# Patient Record
Sex: Female | Born: 1940 | Race: Black or African American | Hispanic: No
Health system: Southern US, Community
[De-identification: ages and names within clinical notes are randomized; demographics above are authoritative.]

## PROBLEM LIST (undated history)

## (undated) DIAGNOSIS — N289 Disorder of kidney and ureter, unspecified: Secondary | ICD-10-CM

---

## 2014-03-25 ENCOUNTER — Inpatient Hospital Stay
Admission: AD | Admit: 2014-03-25 | Discharge: 2014-06-02 | Disposition: A | Discharge status: Skilled Nursing Facility | Payer: Self-pay | Source: Ambulatory Visit | Attending: Internal Medicine | Admitting: Internal Medicine

## 2014-03-25 ENCOUNTER — Other Ambulatory Visit (HOSPITAL_COMMUNITY): Payer: Self-pay

## 2014-03-25 DIAGNOSIS — J189 Pneumonia, unspecified organism: Secondary | ICD-10-CM

## 2014-03-25 DIAGNOSIS — R748 Abnormal levels of other serum enzymes: Secondary | ICD-10-CM

## 2014-03-25 DIAGNOSIS — I82602 Acute embolism and thrombosis of unspecified veins of left upper extremity: Secondary | ICD-10-CM

## 2014-03-25 DIAGNOSIS — R1011 Right upper quadrant pain: Secondary | ICD-10-CM

## 2014-03-25 DIAGNOSIS — W1800XA Striking against unspecified object with subsequent fall, initial encounter: Secondary | ICD-10-CM

## 2014-03-25 DIAGNOSIS — R1032 Left lower quadrant pain: Secondary | ICD-10-CM

## 2014-03-25 DIAGNOSIS — Z930 Tracheostomy status: Secondary | ICD-10-CM | POA: Insufficient documentation

## 2014-03-25 DIAGNOSIS — J90 Pleural effusion, not elsewhere classified: Secondary | ICD-10-CM

## 2014-03-25 DIAGNOSIS — Z4659 Encounter for fitting and adjustment of other gastrointestinal appliance and device: Secondary | ICD-10-CM

## 2014-03-25 DIAGNOSIS — Z9181 History of falling: Secondary | ICD-10-CM

## 2014-03-25 DIAGNOSIS — J9690 Respiratory failure, unspecified, unspecified whether with hypoxia or hypercapnia: Secondary | ICD-10-CM

## 2014-03-25 DIAGNOSIS — I82609 Acute embolism and thrombosis of unspecified veins of unspecified upper extremity: Secondary | ICD-10-CM | POA: Insufficient documentation

## 2014-03-25 DIAGNOSIS — T8249XA Other complication of vascular dialysis catheter, initial encounter: Secondary | ICD-10-CM

## 2014-03-25 DIAGNOSIS — L0291 Cutaneous abscess, unspecified: Secondary | ICD-10-CM

## 2014-03-25 DIAGNOSIS — Z931 Gastrostomy status: Secondary | ICD-10-CM

## 2014-03-25 DIAGNOSIS — J690 Pneumonitis due to inhalation of food and vomit: Secondary | ICD-10-CM

## 2014-03-25 HISTORY — DX: Disorder of kidney and ureter, unspecified: N28.9

## 2014-03-26 ENCOUNTER — Other Ambulatory Visit (HOSPITAL_COMMUNITY): Payer: Self-pay

## 2014-03-26 DIAGNOSIS — L0291 Cutaneous abscess, unspecified: Secondary | ICD-10-CM

## 2014-03-26 DIAGNOSIS — Z4659 Encounter for fitting and adjustment of other gastrointestinal appliance and device: Secondary | ICD-10-CM | POA: Insufficient documentation

## 2014-03-26 DIAGNOSIS — Z87898 Personal history of other specified conditions: Secondary | ICD-10-CM

## 2014-03-26 DIAGNOSIS — J9600 Acute respiratory failure, unspecified whether with hypoxia or hypercapnia: Secondary | ICD-10-CM

## 2014-03-26 DIAGNOSIS — J9690 Respiratory failure, unspecified, unspecified whether with hypoxia or hypercapnia: Secondary | ICD-10-CM | POA: Insufficient documentation

## 2014-03-26 LAB — COMPREHENSIVE METABOLIC PANEL
ALBUMIN: 2.6 g/dL — AB (ref 3.5–5.2)
ALK PHOS: 100 U/L (ref 39–117)
ALT: 7 U/L (ref 0–35)
ANION GAP: 10 (ref 5–15)
AST: 22 U/L (ref 0–37)
BILIRUBIN TOTAL: 1.1 mg/dL (ref 0.3–1.2)
BUN: 34 mg/dL — ABNORMAL HIGH (ref 6–23)
CO2: 24 mmol/L (ref 19–32)
CREATININE: 2.84 mg/dL — AB (ref 0.50–1.10)
Calcium: 10.1 mg/dL (ref 8.4–10.5)
Chloride: 96 mEq/L (ref 96–112)
GFR calc Af Amer: 18 mL/min — ABNORMAL LOW (ref >90)
GFR calc non Af Amer: 16 mL/min — ABNORMAL LOW (ref >90)
GLUCOSE: 123 mg/dL — AB (ref 70–99)
Potassium: 4.4 mmol/L (ref 3.5–5.1)
Sodium: 130 mmol/L — ABNORMAL LOW (ref 135–145)
TOTAL PROTEIN: 6.3 g/dL (ref 6.0–8.3)

## 2014-03-26 LAB — CBC
HCT: 24.3 % — ABNORMAL LOW (ref 36.0–46.0)
HEMATOCRIT: 22.9 % — AB (ref 36.0–46.0)
HEMOGLOBIN: 8 g/dL — AB (ref 12.0–15.0)
HEMOGLOBIN: 7.7 g/dL — AB (ref 12.0–15.0)
MCH: 27.5 pg (ref 26.0–34.0)
MCH: 27.9 pg (ref 26.0–34.0)
MCHC: 32.9 g/dL (ref 30.0–36.0)
MCHC: 33.6 g/dL (ref 30.0–36.0)
MCV: 83.5 fL (ref 78.0–100.0)
MCV: 83 fL (ref 78.0–100.0)
PLATELETS: 311 10*3/uL (ref 150–400)
Platelets: 382 10*3/uL (ref 150–400)
RBC: 2.91 MIL/uL — ABNORMAL LOW (ref 3.87–5.11)
RBC: 2.76 MIL/uL — AB (ref 3.87–5.11)
RDW: 17.2 % — AB (ref 11.5–15.5)
RDW: 17.1 % — AB (ref 11.5–15.5)
WBC: 15.2 10*3/uL — ABNORMAL HIGH (ref 4.0–10.5)
WBC: 13.7 10*3/uL — ABNORMAL HIGH (ref 4.0–10.5)

## 2014-03-26 LAB — RENAL FUNCTION PANEL
ALBUMIN: 2.6 g/dL — AB (ref 3.5–5.2)
Anion gap: 11 (ref 5–15)
BUN: 37 mg/dL — ABNORMAL HIGH (ref 6–23)
CALCIUM: 10.1 mg/dL (ref 8.4–10.5)
CO2: 22 mmol/L (ref 19–32)
Chloride: 99 mmol/L (ref 96–112)
Creatinine, Ser: 3.01 mg/dL — ABNORMAL HIGH (ref 0.50–1.10)
GFR calc Af Amer: 17 mL/min — ABNORMAL LOW (ref >90)
GFR calc non Af Amer: 14 mL/min — ABNORMAL LOW (ref >90)
Glucose, Bld: 127 mg/dL — ABNORMAL HIGH (ref 70–99)
POTASSIUM: 4.5 mmol/L (ref 3.5–5.1)
Phosphorus: 4.4 mg/dL (ref 2.3–4.6)
Sodium: 132 mmol/L — ABNORMAL LOW (ref 135–145)

## 2014-03-26 LAB — LACTIC ACID, PLASMA: Lactic Acid, Venous: 1.9 mmol/L (ref 0.5–2.0)

## 2014-03-26 LAB — CLOSTRIDIUM DIFFICILE BY PCR: Toxigenic C. Difficile by PCR: NEGATIVE

## 2014-03-26 LAB — HEPATITIS B CORE ANTIBODY, IGM: Hep B C IgM: NONREACTIVE

## 2014-03-26 LAB — CORTISOL: Cortisol, Plasma: 11.2 ug/dL

## 2014-03-26 NOTE — Consult Note (Signed)
Name: Mallory Pitts MRN: [id number] DOB: Oct 10, 1940    ADMISSION DATE:  03/25/2014 CONSULTATION DATE:  1/22 REFERRING MD :  Sharyon MedicusHijazi  CHIEF COMPLAINT:  Vent weaning   BRIEF PATIENT DESCRIPTION:  74 year old female [facility]23 w/ CC: abd pain and AMS. Found to have perf rectum and was in septic shock. Went to OR emergently for exploration, washout, repair and placement of colostomy. Course was complicated by on-going septic shock, peritoneal abscess which required perc drainage (since removed as was not draining; but abscess not totally resolved), DIC, sacral decub ulcer,  prolonged pressor requirements, AF w/ RVR and inability to wean. She underwent trach and PEG placement and [facility]  SIGNIFICANT EVENTS  expl lap 12/23 for per rectosig colon PEG 1/8 Trach 1/8   STUDIES:     HISTORY OF PRESENT ILLNESS:   See above   PAST MEDICAL HISTORY :  Septic shock w/ intra abd abscess, VDRF, s/p trach and PEG, AF w/ RVR, ESRD on HD M/W/F, anemia of chronic disease. DIC, h/o lupus anticoagulant, hypothyroidism, thrombocytopenia, anemia of critical illness, gout, DVT on chronic anticoagulation, post-op ileus   Prior to Admission medications   Reviewed    Allergies not on file  FAMILY HISTORY:  family history is not on file. SOCIAL HISTORY:    REVIEW OF SYSTEMS:   Unable  SUBJECTIVE:  Appears comfortable  VITAL SIGNS:    PHYSICAL EXAMINATION: General:  Acute on chronically ill appearing female, remains on full vent support  Neuro:  Awake, moved exts. Generalized weakness HEENT:  Trach in place  Cardiovascular:  rrr Lungs:  Scattered rhonchi  Abdomen:  Colostomy intact, abd incision intact + bowel sounds  Musculoskeletal:  Generalized weakness  Skin:  Intact    Recent Labs Lab 03/26/14 0500  NA 130*  K 4.4  CL 96  CO2 24  BUN 34*  CREATININE 2.84*  GLUCOSE 123*    Recent Labs Lab 03/26/14 0500  HGB 8.0*  HCT  24.3*  WBC 15.2*  PLT 382   Dg Chest Port 1 View  03/26/2014   CLINICAL DATA:  Respiratory failure  EXAM: PORTABLE CHEST - 1 VIEW  COMPARISON:  None.  FINDINGS: Split tip catheter from left IJ approach is at the SVC level. Left subclavian central line is also at the SVC level. There is a tracheostomy tube which is well seated.  Hazy appearance of the mid and lower chest which could reflect effusions, atelectasis, or consolidation. No evidence for pulmonary edema in the upper lungs. No pneumothorax. Heart size and aortic contours are normal for technique.  IMPRESSION: 1. Left-sided catheter are in good position. 2. Poor inflation with bibasilar atelectasis, effusions, or pneumonia.   Electronically Signed   By: Tiburcio PeaJonathan  Watts M.D.   On: 03/26/2014 06:45   Dg Abd Portable 1v  03/25/2014   CLINICAL DATA:  Enteric tube placement.  EXAM: PORTABLE ABDOMEN - 1 VIEW  COMPARISON:  None  FINDINGS: There is a gastrostomy tube that projects within the mid stomach. Bowel gas pattern is unremarkable. There are surgical vascular clips superimposed of the pelvis.  IMPRESSION: Gastrostomy tube projects within the mid stomach   Electronically Signed   By: Amie Portlandavid  Ormond M.D.   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  S/p colostomy On-going septic shock as of 1/21 Anemia of critical illness.  Protein calorie malnutrition  ESRD  Acute encephalopathy  Deconditioning    Discussion  Challenging case here. Remains on vent support, trach dependent, ESRD which in and of itself is always a challenge. She remains in shock in setting of known intra-abd abscess. Hope that we can get her off pressors, then resume weaning efforts.   Plan/rec Cont full vent support Ck cortisol, start stress dose steroids Ck lactate Cont broad spec abx  until abscess radiographically resolved.  Accept MAP >55 Wound care and nutritional support per primary team     Simonne Martinet ACNP-BC [facility] Pulmonary/Critical Care Pager # [phone] OR # [phone] if no answer   03/26/2014, 11:19 AM  STAFF NOTE: I, Rory Percy, MD FACP have personally reviewed patient's available data, including medical history, events of note, physical examination and test results as part of my evaluation. I have discussed with resident/NP and other care providers such as pharmacist, RN and RRT. In addition, I personally evaluated patient and elicited key findings of: ESRD likely runs low at baseline, would accept MAP 50 with good MS, assess for rel AI, tsh, may need to add vasopressin if remains on levo this weekend, weaning start PS 10-12 with low dose levo is OK, will need repeat imaging CT, especially if pressors remain, unsure on reason for flagyl as remains on zosyn with adequate anaerobe coverage May need aline  Mcarthur Rossetti. Tyson Alias, MD, FACP Pgr: [phone] [place] [facility] 03/26/2014 12:31 PM

## 2014-03-27 LAB — TSH: TSH: 3.86 u[IU]/mL (ref 0.350–4.500)

## 2014-03-27 LAB — PARATHYROID HORMONE, INTACT (NO CA): PTH: 461 pg/mL — AB (ref 15–65)

## 2014-03-27 LAB — HEPATITIS B SURFACE ANTIBODY, QUANTITATIVE: Hepatitis B-Post: 193.8 m[IU]/mL (ref >9.9)

## 2014-03-27 LAB — HEMOGLOBIN AND HEMATOCRIT, BLOOD
HCT: 32.3 % — ABNORMAL LOW (ref 36.0–46.0)
Hemoglobin: 10.8 g/dL — ABNORMAL LOW (ref 12.0–15.0)

## 2014-03-27 LAB — DIGOXIN LEVEL: Digoxin Level: 1.6 ng/mL (ref 0.8–2.0)

## 2014-03-28 ENCOUNTER — Other Ambulatory Visit (HOSPITAL_COMMUNITY): Payer: Self-pay

## 2014-03-28 LAB — BASIC METABOLIC PANEL
ANION GAP: 11 (ref 5–15)
BUN: 53 mg/dL — ABNORMAL HIGH (ref 6–23)
CALCIUM: 8.8 mg/dL (ref 8.4–10.5)
CO2: 23 mmol/L (ref 19–32)
Chloride: 101 mmol/L (ref 96–112)
Creatinine, Ser: 3.26 mg/dL — ABNORMAL HIGH (ref 0.50–1.10)
GFR calc Af Amer: 15 mL/min — ABNORMAL LOW (ref >90)
GFR calc non Af Amer: 13 mL/min — ABNORMAL LOW (ref >90)
Glucose, Bld: 179 mg/dL — ABNORMAL HIGH (ref 70–99)
POTASSIUM: 4.3 mmol/L (ref 3.5–5.1)
Sodium: 135 mmol/L (ref 135–145)

## 2014-03-28 LAB — CBC
HCT: 20.2 % — ABNORMAL LOW (ref 36.0–46.0)
Hemoglobin: 6.7 g/dL — CL (ref 12.0–15.0)
MCH: 27.5 pg (ref 26.0–34.0)
MCHC: 33.2 g/dL (ref 30.0–36.0)
MCV: 82.8 fL (ref 78.0–100.0)
Platelets: 308 10*3/uL (ref 150–400)
RBC: 2.44 MIL/uL — ABNORMAL LOW (ref 3.87–5.11)
RDW: 16.9 % — ABNORMAL HIGH (ref 11.5–15.5)
WBC: 10.5 10*3/uL (ref 4.0–10.5)

## 2014-03-28 LAB — PREPARE RBC (CROSSMATCH)

## 2014-03-28 LAB — ABO/RH: ABO/RH(D): O POS

## 2014-03-29 DIAGNOSIS — I950 Idiopathic hypotension: Secondary | ICD-10-CM

## 2014-03-29 DIAGNOSIS — J9620 Acute and chronic respiratory failure, unspecified whether with hypoxia or hypercapnia: Secondary | ICD-10-CM

## 2014-03-29 DIAGNOSIS — Z930 Tracheostomy status: Secondary | ICD-10-CM

## 2014-03-29 LAB — CBC
HCT: 21.3 % — ABNORMAL LOW (ref 36.0–46.0)
HEMOGLOBIN: 7.4 g/dL — AB (ref 12.0–15.0)
MCH: 29 pg (ref 26.0–34.0)
MCHC: 34.7 g/dL (ref 30.0–36.0)
MCV: 83.5 fL (ref 78.0–100.0)
Platelets: 229 10*3/uL (ref 150–400)
RBC: 2.55 MIL/uL — ABNORMAL LOW (ref 3.87–5.11)
RDW: 17 % — ABNORMAL HIGH (ref 11.5–15.5)
WBC: 7.7 10*3/uL (ref 4.0–10.5)

## 2014-03-29 LAB — TYPE AND SCREEN
ABO/RH(D): O POS
Antibody Screen: NEGATIVE
UNIT DIVISION: 0

## 2014-03-29 LAB — RENAL FUNCTION PANEL
ALBUMIN: 2.6 g/dL — AB (ref 3.5–5.2)
Anion gap: 13 (ref 5–15)
BUN: 69 mg/dL — ABNORMAL HIGH (ref 6–23)
CO2: 21 mmol/L (ref 19–32)
CREATININE: 3.54 mg/dL — AB (ref 0.50–1.10)
Calcium: 9.1 mg/dL (ref 8.4–10.5)
Chloride: 102 mmol/L (ref 96–112)
GFR calc non Af Amer: 12 mL/min — ABNORMAL LOW (ref >90)
GFR, EST AFRICAN AMERICAN: 14 mL/min — AB (ref >90)
Glucose, Bld: 182 mg/dL — ABNORMAL HIGH (ref 70–99)
Phosphorus: 5.2 mg/dL — ABNORMAL HIGH (ref 2.3–4.6)
Potassium: 4.3 mmol/L (ref 3.5–5.1)
Sodium: 136 mmol/L (ref 135–145)

## 2014-03-29 LAB — HEPATITIS B E ANTIBODY: Hep B E Ab: NONREACTIVE

## 2014-03-29 NOTE — Progress Notes (Addendum)
Name: Mallory Pitts MRN: [id number] DOB: [birth year]    ADMISSION DATE:  03/25/2014 CONSULTATION DATE:  1/22 REFERRING MD :  Sharyon Medicus  CHIEF COMPLAINT:  Vent weaning   BRIEF PATIENT DESCRIPTION:  74 year old female admitted to [facility] on 12/23 w/ CC: abd pain and AMS. Found to have perf rectum and was in septic shock. Went to OR emergently for exploration, washout, repair and placement of colostomy. Course was complicated by on-going septic shock, peritoneal abscess which required perc drainage (since removed as was not draining; but abscess not totally resolved), DIC, sacral decub ulcer,  prolonged pressor requirements, AF w/ RVR and inability to wean. She underwent trach and PEG placement and was transferred to [facility] for further care  SIGNIFICANT EVENTS  expl lap 12/23 for per rectosig colon PEG 1/8 Trach 1/8   STUDIES:   SUBJECTIVE:  Appears comfortable  VITAL SIGNS:    PHYSICAL EXAMINATION: General:  Acute on chronically ill appearing female, remains on full vent support , on HD currently Neuro:  Awake, moved exts. Generalized weakness HEENT:  Trach in place  Cardiovascular:  rrr Lungs:  Scattered rhonchi  Abdomen:  Colostomy intact, abd incision intact + bowel sounds  Musculoskeletal:  Generalized weakness  Skin:  Intact    Recent Labs Lab 03/26/14 1157 03/28/14 1500 03/29/14 0515  NA 132* 135 136  K 4.5 4.3 4.3  CL 99 101 102  CO2 BUN 37* 53* 69*  CREATININE 3.01* 3.26* 3.54*  GLUCOSE 127* 179* 182*    Recent Labs Lab 03/26/14 1157 03/27/14 0500 03/28/14 1500 03/29/14 0515  HGB 7.7* 10.8* 6.7* 7.4*  HCT 22.9* 32.3* 20.2* 21.3*  WBC 13.7*  --  10.5 7.7  PLT 311  --  308 229   Dg Chest Port 1 View  03/28/2014   CLINICAL DATA:  Pleural effusion  EXAM: PORTABLE CHEST - 1 VIEW  COMPARISON:  03/26/2014  FINDINGS: Tracheostomy tube as well as left-sided central venous lines are again seen and stable. Cardiac shadow is within normal limits. Left  basilar atelectatic changes/early infiltrate are noted. No sizable effusion is seen. No bony abnormality is noted.  IMPRESSION: Left basilar changes.   Electronically Signed   By: Alcide Clever M.D.   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  S/p colostomy On-going septic shock as of 1/21 Anemia of critical illness.  Protein calorie malnutrition  ESRD  Acute encephalopathy  Deconditioning    Discussion  Challenging case here. Remains on vent support, trach dependent, ESRD which in and of itself is always a challenge. Wean as tolerated.   Plan/rec Cont full vent support Ck cortisol, start stress dose steroids Ck lactate Cont broad spec abx until abscess radiographically resolved.  Accept MAP >55 Wound care and nutritional support per primary team    Brett Canales Minor ACNP [facility] P[facility] Pager [phone] till 3 pm If no answer page [phone]  Goal PS trials of 12/5 for 4 hours today, patient is doing very well.  Will keep on PS for longer than the protocol states given how well he is doing.  Anticipate will progress to TC over the next few days.  Continue abx until abscess resolve on x-ray.  Wound care and nutrition per [facility].  Continue stress dose steroids and will check a cortisol  level.  Patient seen and examined, agree with above note.  I dictated the care and orders written for this patient under my direction.  Alyson ReedyWesam G Braedyn Kauk, MD [phone]  03/29/2014, 10:07 AM

## 2014-03-30 ENCOUNTER — Other Ambulatory Visit (HOSPITAL_COMMUNITY): Payer: Self-pay

## 2014-03-30 MED ORDER — IOHEXOL 300 MG/ML  SOLN
100.0000 mL | Freq: Once | INTRAMUSCULAR | Status: AC | PRN
  Administered 2014-03-30: 100 mL via INTRAVENOUS

## 2014-03-31 DIAGNOSIS — J90 Pleural effusion, not elsewhere classified: Secondary | ICD-10-CM | POA: Insufficient documentation

## 2014-03-31 DIAGNOSIS — Z930 Tracheostomy status: Secondary | ICD-10-CM | POA: Insufficient documentation

## 2014-03-31 LAB — VANCOMYCIN, RANDOM: VANCOMYCIN RM: 15.1 ug/mL

## 2014-03-31 LAB — RENAL FUNCTION PANEL
Albumin: 2.7 g/dL — ABNORMAL LOW (ref 3.5–5.2)
Anion gap: 11 (ref 5–15)
BUN: 73 mg/dL — ABNORMAL HIGH (ref 6–23)
CALCIUM: 8.1 mg/dL — AB (ref 8.4–10.5)
CO2: 23 mmol/L (ref 19–32)
Chloride: 100 mmol/L (ref 96–112)
Creatinine, Ser: 3.02 mg/dL — ABNORMAL HIGH (ref 0.50–1.10)
GFR calc non Af Amer: 14 mL/min — ABNORMAL LOW (ref >90)
GFR, EST AFRICAN AMERICAN: 17 mL/min — AB (ref >90)
Glucose, Bld: 111 mg/dL — ABNORMAL HIGH (ref 70–99)
POTASSIUM: 3 mmol/L — AB (ref 3.5–5.1)
Phosphorus: 5.3 mg/dL — ABNORMAL HIGH (ref 2.3–4.6)
Sodium: 134 mmol/L — ABNORMAL LOW (ref 135–145)

## 2014-03-31 LAB — IRON AND TIBC
IRON: 78 ug/dL (ref 42–145)
SATURATION RATIOS: 78 % — AB (ref 20–55)
TIBC: 100 ug/dL — ABNORMAL LOW (ref 250–470)
UIBC: 22 ug/dL — AB (ref 125–400)

## 2014-03-31 LAB — CBC
HEMATOCRIT: 21.6 % — AB (ref 36.0–46.0)
HEMOGLOBIN: 7.4 g/dL — AB (ref 12.0–15.0)
MCH: 27.9 pg (ref 26.0–34.0)
MCHC: 34.3 g/dL (ref 30.0–36.0)
MCV: 81.5 fL (ref 78.0–100.0)
PLATELETS: 195 10*3/uL (ref 150–400)
RBC: 2.65 MIL/uL — ABNORMAL LOW (ref 3.87–5.11)
RDW: 17.2 % — AB (ref 11.5–15.5)
WBC: 8 10*3/uL (ref 4.0–10.5)

## 2014-03-31 LAB — HEPATITIS B E ANTIGEN: Hep B E Ag: NONREACTIVE

## 2014-03-31 NOTE — Consult Note (Signed)
Chief Complaint: Abd abscess  Referring Physician(s): Hijazi History of Present Illness: Mallory Pitts is a 74 y.o. female   Pt was transferred to [facility] 03/25/2014 From [facility] Rectal perforation and colorectal surgery Had abscess drain placed after original surgery--but removed before transfer to [facility] Trach now-- alert  Noted new abd pain; labile fever CT scan 1/26 does reveal continued perirectal abscess Request for possible drain placement in IR Dr Loreta AveWagner has reviewed imaging and spoken to Dr Sharyon MedicusHijazi He has approved procedure II have seen and examined pt  No past medical history on file.  No past surgical history on file.  Allergies: Review of patient's allergies indicates not on file.  Medications: Prior to Admission medications   Not on File    No family history on file.  History   Social History  . Marital Status: Widowed    Spouse Name: N/A    Number of Children: N/A  . Years of Education: N/A   Social History Main Topics  . Smoking status: Not on file  . Smokeless tobacco: Not on file  . Alcohol Use: Not on file  . Drug Use: Not on file  . Sexual Activity: Not on file   Other Topics Concern  . Not on file   Social History Narrative  . No narrative on file     Review of Systems: A 12 point ROS discussed and pertinent positives are indicated in the HPI above.  All other systems are negative.  Review of Systems  Respiratory: Positive for wheezing. Negative for choking and shortness of breath.   Gastrointestinal: Positive for abdominal pain and abdominal distention.  Musculoskeletal: Positive for back pain.  Neurological: Positive for weakness.    Vital Signs: There were no vitals taken for this visit.  Physical Exam  Constitutional: She is oriented to person, place, and time.  Cardiovascular: Normal rate and regular rhythm.   Pulmonary/Chest: Effort normal. She has wheezes.  Abdominal: Soft. Bowel sounds are normal.  Gastric tube intact/  in use  Musculoskeletal: Normal range of motion.  Neurological: She is alert and oriented to person, place, and time.  Pt is alert; nods yes/no appropriate to all questions  Skin: Skin is warm and dry.  Psychiatric:  Consented with sister at bedside  Nursing note and vitals reviewed.   Imaging: Ct Abdomen Pelvis W Contrast  03/30/2014   CLINICAL DATA:  Rectal perforation 02/24/2014. Intra-abdominal abscess.  EXAM: CT ABDOMEN AND PELVIS WITH CONTRAST  TECHNIQUE: Multidetector CT imaging of the abdomen and pelvis was performed using the standard protocol following bolus administration of intravenous contrast.  CONTRAST:  100mL OMNIPAQUE IOHEXOL 300 MG/ML  SOLN  COMPARISON:  None.  FINDINGS: Lower chest: Small bilateral effusions. There is lower lobe consolidation with air bronchograms, right greater than left.  Hepatobiliary: There is a 17 mm left hepatic lobe cyst. No other focal liver lesions. Gallbladder contains vicarious contrast and probable calculus. No bile duct dilatation.  Pancreas: There is a 2.5 cm indeterminate pancreatic tail cyst. Question an additional 1.7 cm pancreatic head cyst. Neither of these is characterized.  Spleen: Unremarkable  Adrenals/Urinary Tract: The adrenals appear normal. There are cysts about both kidneys, as well as renal atrophy. The largest cyst measures 2.9 cm in the right lower pole and appears somewhat complex, not characterized.  Stomach/Bowel: Stomach and small bowel appear unremarkable. There is a distal descending colostomy in the low midline. There is a rectal stump.  Vascular/Lymphatic: Mildly prominent retroperitoneal and mesenteric nodes are present.  The largest retroperitoneal node is in the left para-aortic region below the left renal vein, measuring 1.7 cm. These nodes may be reactive.  Reproductive: Unremarkable  Other: There is a 2.6 by 6.6 cm subcutaneous collection in the low anterior abdomen just to the right of midline, slightly below the ostomy. At  the level of the ostomy there is a 2.1 x 3.1 cm collection of extraluminal air in the peritoneum, visible on image 82 series 3. This presumably represents residual abscess cavity. Directly anterior to the rectal stump there is a 3.7 x 4.5 cm collection which is indeterminate. This could represent another abscess. It may represent stool within the rectal stump as the boundaries of the rectal stump are poorly defined.  There is extensive subcutaneous edema consistent with anasarca. There also is edema throughout the mesentery and small volume ascites in the pericolic gutters.  * Musculoskeletal: Poor bone density. Marked sclerosis of many of the bones indeterminate pancreatic cyst,, not characterized. 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  IMPRESSION: *2.6 x 6.6 cm subcutaneous collection in the anterior abdomen just below and to the right of the ostomy, likely abscess in this clinical setting *2.1 x 3.1 cm collection of extraluminal air and in the anterior peritoneum at the level of the ostomy, likely residual abscess cavity *3.7 x 4.57 cm collection adjacent to the rectal stump, indeterminate with regard to rectal stump contents versus adjacent abscess. *Extensive anasarca, mesenteric edema, small volume ascites *Indeterminate pancreatic cysts measuring at 2.5 cm. *Indeterminate renal cysts *If outside CT scans can be made available, we will compare and provide an addendum   Electronically Signed   By: Ellery Plunk M.D.   On: 03/30/2014 22:30   Dg Chest Port 1 View  03/28/2014   CLINICAL DATA:  Pleural effusion  EXAM: PORTABLE CHEST - 1 VIEW  COMPARISON:  03/26/2014  FINDINGS: Tracheostomy tube as well as left-sided central venous lines are again seen and stable. Cardiac shadow is within normal limits. Left basilar atelectatic changes/early infiltrate are noted. No sizable effusion is seen. No bony abnormality is noted.  IMPRESSION: Left basilar changes.   Electronically Signed   By: Alcide Clever M.D.   On:  03/28/2014 12:57   Dg Chest Port 1 View  03/26/2014   CLINICAL DATA:  Respiratory failure  EXAM: PORTABLE CHEST - 1 VIEW  COMPARISON:  None.  FINDINGS: Split tip catheter from left IJ approach is at the SVC level. Left subclavian central line is also at the SVC level. There is a tracheostomy tube which is well seated.  Hazy appearance of the mid and lower chest which could reflect effusions, atelectasis, or consolidation. No evidence for pulmonary edema in the upper lungs. No pneumothorax. Heart size and aortic contours are normal for technique.  IMPRESSION: 1. Left-sided catheter are in good position. 2. Poor inflation with bibasilar atelectasis, effusions, or pneumonia.   Electronically Signed   By: Tiburcio Pea M.D.   On: 03/26/2014 06:45   Dg Abd Portable 1v  03/25/2014   CLINICAL DATA:  Enteric tube placement.  EXAM: PORTABLE ABDOMEN - 1 VIEW  COMPARISON:  None  FINDINGS: There is a gastrostomy tube that projects within the mid stomach. Bowel gas pattern is unremarkable. There are surgical vascular clips superimposed of the pelvis.  IMPRESSION: Gastrostomy tube projects within the mid stomach   Electronically Signed   By: Amie Portland M.D.   On: 03/25/2014 21:39    Labs:  CBC:  Recent Labs  03/26/14 1157 03/27/14 0500 03/28/14  1500 03/29/14 0515 03/31/14 0650  WBC 13.7*  --  10.5 7.7 8.0  HGB 7.7* 10.8* 6.7* 7.4* 7.4*  HCT 22.9* 32.3* 20.2* 21.3* 21.6*  PLT 311  --  308 229 195    COAGS: No results for input(s): INR, APTT in the last 8760 hours.  BMP:  Recent Labs  03/26/14 1157 03/28/14 1500 03/29/14 0515 03/31/14 0650  NA 132* 135 136 134*  K 4.5 4.3 4.3 3.0*  CL 99 101 102 100  CO2 GLUCOSE 127* 179* 182* 111*  BUN 37* 53* 69* 73*  CALCIUM 10.1 8.8 9.1 8.1*  CREATININE 3.01* 3.26* 3.54* 3.02*  GFRNONAA 14* 13* 12* 14*  GFRAA 17* 15* 14* 17*    LIVER FUNCTION TESTS:  Recent Labs  03/26/14 0500 03/26/14 1157 03/29/14 0515 03/31/14 0650    BILITOT 1.1  --   --   --   AST 22  --   --   --   ALT 7  --   --   --   ALKPHOS 100  --   --   --   PROT 6.3  --   --   --   ALBUMIN 2.6* 2.6* 2.6* 2.7*    TUMOR MARKERS: No results for input(s): AFPTM, CEA, CA199, CHROMGRNA in the last 8760 hours.  Assessment and Plan:  Scheduled for perirectal abscess drain placement in CT 1/28 Check labs Consent signed andin chart Pt and family aware of procedure benefits and risks including but not limited to: Infection; bleeding; inability for access Agreeable to proceed  Thank you for this interesting consult.  I greatly enjoyed meeting Mallory Pitts and look forward to participating in their care.  Signed: Will Schier A 03/31/2014, 3:42 PM   I spent a total of 40 minutes face to face in clinical consultation, greater than 50% of which was counseling/coordinating care for perirectal abscess drain placement

## 2014-03-31 NOTE — Progress Notes (Signed)
Name: Mallory Pitts MRN: [id number] DOB: 1940-10-19    ADMISSION DATE:  03/25/2014 CONSULTATION DATE:  1/22 REFERRING MD :  Sharyon Medicus  CHIEF COMPLAINT:  Vent weaning   BRIEF PATIENT DESCRIPTION:  74 year old female admitted to [facility] on 12/23 w/ CC: abd pain and AMS. Found to have perf rectum and was in septic shock. Went to OR emergently for exploration, washout, repair and placement of colostomy. Course was complicated by on-going septic shock, peritoneal abscess which required perc drainage (since removed as was not draining; but abscess not totally resolved), DIC, sacral decub ulcer,  prolonged pressor requirements, AF w/ RVR and inability to wean. She underwent trach and PEG placement and was transferred to [facility] for further care  SIGNIFICANT EVENTS  expl lap 12/23 for per rectosig colon PEG 1/8>> Trach 1/8>>   STUDIES:   SUBJECTIVE:  Appears comfortable  VITAL SIGNS: Vital signs reviewed. Abnormal values will appear under impression plan section.    PHYSICAL EXAMINATION: General:  Acute on chronically ill appearing female, remains on full vent support , on HD currently Neuro:  Awake, moved exts. Generalized weakness. Lip speaks, looks pitiful HEENT:  Trach in place  Cardiovascular:  rrr Lungs:  Scattered rhonchi  Abdomen:  Colostomy intact, abd incision intact + bowel sounds  Musculoskeletal:  Generalized weakness  Skin:  Intact    Recent Labs Lab 03/28/14 1500 03/29/14 0515 03/31/14 0650  NA 135 136 134*  K 4.3 4.3 3.0*  CL 101 102 100  CO2 BUN 53* 69* 73*  CREATININE 3.26* 3.54* 3.02*  GLUCOSE 179* 182* 111*    Recent Labs Lab 03/28/14 1500 03/29/14 0515 03/31/14 0650  HGB 6.7* 7.4* 7.4*  HCT 20.2* 21.3* 21.6*  WBC 10.5 7.7 8.0  PLT 308 229 195   Ct Abdomen Pelvis W Contrast  03/30/2014   CLINICAL DATA:  Rectal perforation 02/24/2014. Intra-abdominal abscess.  EXAM: CT ABDOMEN AND PELVIS WITH CONTRAST  TECHNIQUE: Multidetector CT  imaging of the abdomen and pelvis was performed using the standard protocol following bolus administration of intravenous contrast.  CONTRAST:  OMNIPAQUE IOHEXOL 300 MG/ML  SOLN  COMPARISON:  None.  FINDINGS: Lower chest: Small bilateral effusions. There is lower lobe consolidation with air bronchograms, right greater than left.  Hepatobiliary: There is a 17 mm left hepatic lobe cyst. No other focal liver lesions. Gallbladder contains vicarious contrast and probable calculus. No bile duct dilatation.  Pancreas: There is a 2.5 cm indeterminate pancreatic tail cyst. Question an additional 1.7 cm pancreatic head cyst. Neither of these is characterized.  Spleen: Unremarkable  Adrenals/Urinary Tract: The adrenals appear normal. There are cysts about both kidneys, as well as renal atrophy. The largest cyst measures 2.9 cm in the right lower pole and appears somewhat complex, not characterized.  Stomach/Bowel: Stomach and small bowel appear unremarkable. There is a distal descending colostomy in the low midline. There is a rectal stump.  Vascular/Lymphatic: Mildly prominent retroperitoneal and mesenteric nodes are present. The largest retroperitoneal node is in the left para-aortic region below the left renal vein, measuring 1.7 cm. These nodes may be reactive.  Reproductive: Unremarkable  Other: There is a 2.6 by 6.6 cm subcutaneous collection in the low anterior abdomen just to the right of midline, slightly below the ostomy. At the level of the ostomy there is a 2.1 x 3.1 cm collection of extraluminal air in the peritoneum, visible on image 82 series 3. This presumably represents residual abscess cavity. Directly  anterior to the rectal stump there is a 3.7 x 4.5 cm collection which is indeterminate. This could represent another abscess. It may represent stool within the rectal stump as the boundaries of the rectal stump are poorly defined.  There is extensive subcutaneous edema consistent with anasarca. There also  is edema throughout the mesentery and small volume ascites in the pericolic gutters.  * Musculoskeletal: Poor bone density. Marked sclerosis of many of the bones indeterminate pancreatic cyst,, not characterized. 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  IMPRESSION: *2.6 x 6.6 cm subcutaneous collection in the anterior abdomen just below and to the right of the ostomy, likely abscess in this clinical setting *2.1 x 3.1 cm collection of extraluminal air and in the anterior peritoneum at the level of the ostomy, likely residual abscess cavity *3.7 x 4.57 cm collection adjacent to the rectal stump, indeterminate with regard to rectal stump contents versus adjacent abscess. *Extensive anasarca, mesenteric edema, small volume ascites *Indeterminate pancreatic cysts measuring at 2.5 cm. *Indeterminate renal cysts *If outside CT scans can be made available, we will compare and provide an addendum   Electronically Signed   By: Ellery Plunkaniel R Mitchell M.D.   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  S/p colostomy On-going septic shock as of 1/21 Anemia of critical illness.  Protein calorie malnutrition  ESRD  Acute encephalopathy  Deconditioning    Discussion  Challenging case here. Remains on vent support, trach dependent, ESRD which in and of itself is always a challenge. Wean as tolerated.   Plan/rec Cont full vent support and wean per protocol Ck cortisol (11.2),stress dose steroids Ck lactate>>1.9 Cont broad spec abx until abscess radiographically resolved.  Accept MAP >55 Wound care and nutritional support per primary team    Brett CanalesSteve Minor ACNP [facility] Pager [phone] till 3 pm If no answer page [phone]  Goal is PS for 16 hours at 12/5 then begin titrate of PS down to TC per protocol.  Accept lower MAP given low  lactate.  Continue rehab.  Patient seen and examined, agree with above note.  I dictated the care and orders written for this patient under my direction.  Alyson ReedyWesam G Yacoub, MD [phone]  03/31/2014, 9:48 AM

## 2014-04-01 ENCOUNTER — Other Ambulatory Visit (HOSPITAL_COMMUNITY): Payer: Self-pay

## 2014-04-01 LAB — BASIC METABOLIC PANEL
Anion gap: 10 (ref 5–15)
BUN: 36 mg/dL — AB (ref 6–23)
CO2: 24 mmol/L (ref 19–32)
Calcium: 8.6 mg/dL (ref 8.4–10.5)
Chloride: 102 mmol/L (ref 96–112)
Creatinine, Ser: 2.03 mg/dL — ABNORMAL HIGH (ref 0.50–1.10)
GFR calc non Af Amer: 23 mL/min — ABNORMAL LOW (ref >90)
GFR, EST AFRICAN AMERICAN: 27 mL/min — AB (ref >90)
Glucose, Bld: 113 mg/dL — ABNORMAL HIGH (ref 70–99)
Potassium: 4.1 mmol/L (ref 3.5–5.1)
Sodium: 136 mmol/L (ref 135–145)

## 2014-04-01 LAB — FERRITIN: FERRITIN: 1545 ng/mL — AB (ref 10–291)

## 2014-04-01 LAB — PREPARE RBC (CROSSMATCH)

## 2014-04-01 LAB — PROTIME-INR
INR: 1.41 (ref 0.00–1.49)
Prothrombin Time: 17.4 seconds — ABNORMAL HIGH (ref 11.6–15.2)

## 2014-04-01 LAB — TRANSFERRIN: Transferrin: 82 mg/dL — ABNORMAL LOW (ref 200–370)

## 2014-04-01 LAB — APTT: APTT: 32 s (ref 24–37)

## 2014-04-02 LAB — CBC
HEMATOCRIT: 22.3 % — AB (ref 36.0–46.0)
HEMOGLOBIN: 7.5 g/dL — AB (ref 12.0–15.0)
MCH: 27.7 pg (ref 26.0–34.0)
MCHC: 33.6 g/dL (ref 30.0–36.0)
MCV: 82.3 fL (ref 78.0–100.0)
Platelets: 154 10*3/uL (ref 150–400)
RBC: 2.71 MIL/uL — ABNORMAL LOW (ref 3.87–5.11)
RDW: 17.7 % — ABNORMAL HIGH (ref 11.5–15.5)
WBC: 8 10*3/uL (ref 4.0–10.5)

## 2014-04-02 LAB — RENAL FUNCTION PANEL
ALBUMIN: 2.7 g/dL — AB (ref 3.5–5.2)
Anion gap: 10 (ref 5–15)
BUN: 49 mg/dL — AB (ref 6–23)
CALCIUM: 8.9 mg/dL (ref 8.4–10.5)
CO2: 24 mmol/L (ref 19–32)
Chloride: 102 mmol/L (ref 96–112)
Creatinine, Ser: 2.64 mg/dL — ABNORMAL HIGH (ref 0.50–1.10)
GFR, EST AFRICAN AMERICAN: 20 mL/min — AB (ref >90)
GFR, EST NON AFRICAN AMERICAN: 17 mL/min — AB (ref >90)
Glucose, Bld: 132 mg/dL — ABNORMAL HIGH (ref 70–99)
PHOSPHORUS: 5 mg/dL — AB (ref 2.3–4.6)
Potassium: 3.7 mmol/L (ref 3.5–5.1)
Sodium: 136 mmol/L (ref 135–145)

## 2014-04-04 LAB — TYPE AND SCREEN
ABO/RH(D): O POS
Antibody Screen: NEGATIVE
UNIT DIVISION: 0
Unit division: 0

## 2014-04-05 LAB — CBC
HCT: 21.3 % — ABNORMAL LOW (ref 36.0–46.0)
HEMOGLOBIN: 7 g/dL — AB (ref 12.0–15.0)
MCH: 27.7 pg (ref 26.0–34.0)
MCHC: 32.9 g/dL (ref 30.0–36.0)
MCV: 84.2 fL (ref 78.0–100.0)
PLATELETS: 147 10*3/uL — AB (ref 150–400)
RBC: 2.53 MIL/uL — ABNORMAL LOW (ref 3.87–5.11)
RDW: 18.2 % — ABNORMAL HIGH (ref 11.5–15.5)
WBC: 8.8 10*3/uL (ref 4.0–10.5)

## 2014-04-05 LAB — RENAL FUNCTION PANEL
ALBUMIN: 2.6 g/dL — AB (ref 3.5–5.2)
Anion gap: 10 (ref 5–15)
BUN: 54 mg/dL — ABNORMAL HIGH (ref 6–23)
CO2: 23 mmol/L (ref 19–32)
Calcium: 8.5 mg/dL (ref 8.4–10.5)
Chloride: 103 mmol/L (ref 96–112)
Creatinine, Ser: 2.77 mg/dL — ABNORMAL HIGH (ref 0.50–1.10)
GFR calc Af Amer: 19 mL/min — ABNORMAL LOW (ref >90)
GFR calc non Af Amer: 16 mL/min — ABNORMAL LOW (ref >90)
GLUCOSE: 128 mg/dL — AB (ref 70–99)
PHOSPHORUS: 4.3 mg/dL (ref 2.3–4.6)
Potassium: 3.3 mmol/L — ABNORMAL LOW (ref 3.5–5.1)
Sodium: 136 mmol/L (ref 135–145)

## 2014-04-05 LAB — VANCOMYCIN, RANDOM: Vancomycin Rm: 21.7 ug/mL

## 2014-04-05 LAB — DIGOXIN LEVEL: Digoxin Level: 0.8 ng/mL (ref 0.8–2.0)

## 2014-04-05 LAB — PREPARE RBC (CROSSMATCH)

## 2014-04-06 LAB — TYPE AND SCREEN
ABO/RH(D): O POS
Antibody Screen: NEGATIVE
UNIT DIVISION: 0
Unit division: 0

## 2014-04-07 LAB — CBC
HEMATOCRIT: 26.2 % — AB (ref 36.0–46.0)
Hemoglobin: 8.9 g/dL — ABNORMAL LOW (ref 12.0–15.0)
MCH: 28.2 pg (ref 26.0–34.0)
MCHC: 34 g/dL (ref 30.0–36.0)
MCV: 82.9 fL (ref 78.0–100.0)
Platelets: 112 10*3/uL — ABNORMAL LOW (ref 150–400)
RBC: 3.16 MIL/uL — ABNORMAL LOW (ref 3.87–5.11)
RDW: 18 % — AB (ref 11.5–15.5)
WBC: 8.1 10*3/uL (ref 4.0–10.5)

## 2014-04-07 LAB — RENAL FUNCTION PANEL
Albumin: 2.9 g/dL — ABNORMAL LOW (ref 3.5–5.2)
Anion gap: 11 (ref 5–15)
BUN: 44 mg/dL — ABNORMAL HIGH (ref 6–23)
CO2: 24 mmol/L (ref 19–32)
CREATININE: 2.58 mg/dL — AB (ref 0.50–1.10)
Calcium: 9.3 mg/dL (ref 8.4–10.5)
Chloride: 104 mmol/L (ref 96–112)
GFR calc Af Amer: 20 mL/min — ABNORMAL LOW (ref >90)
GFR calc non Af Amer: 17 mL/min — ABNORMAL LOW (ref >90)
GLUCOSE: 113 mg/dL — AB (ref 70–99)
POTASSIUM: 3.1 mmol/L — AB (ref 3.5–5.1)
Phosphorus: 4.2 mg/dL (ref 2.3–4.6)
SODIUM: 139 mmol/L (ref 135–145)

## 2014-04-09 LAB — RENAL FUNCTION PANEL
ANION GAP: 3 — AB (ref 5–15)
Albumin: 2.7 g/dL — ABNORMAL LOW (ref 3.5–5.2)
BUN: 32 mg/dL — AB (ref 6–23)
CO2: 29 mmol/L (ref 19–32)
Calcium: 9.2 mg/dL (ref 8.4–10.5)
Chloride: 104 mmol/L (ref 96–112)
Creatinine, Ser: 2.23 mg/dL — ABNORMAL HIGH (ref 0.50–1.10)
GFR calc Af Amer: 24 mL/min — ABNORMAL LOW (ref >90)
GFR calc non Af Amer: 21 mL/min — ABNORMAL LOW (ref >90)
Glucose, Bld: 145 mg/dL — ABNORMAL HIGH (ref 70–99)
POTASSIUM: 3.6 mmol/L (ref 3.5–5.1)
Phosphorus: 3.5 mg/dL (ref 2.3–4.6)
Sodium: 136 mmol/L (ref 135–145)

## 2014-04-09 LAB — CBC
HEMATOCRIT: 26.5 % — AB (ref 36.0–46.0)
Hemoglobin: 8.7 g/dL — ABNORMAL LOW (ref 12.0–15.0)
MCH: 28.3 pg (ref 26.0–34.0)
MCHC: 32.8 g/dL (ref 30.0–36.0)
MCV: 86.3 fL (ref 78.0–100.0)
Platelets: 84 10*3/uL — ABNORMAL LOW (ref 150–400)
RBC: 3.07 MIL/uL — ABNORMAL LOW (ref 3.87–5.11)
RDW: 18.2 % — ABNORMAL HIGH (ref 11.5–15.5)
WBC: 7.6 10*3/uL (ref 4.0–10.5)

## 2014-04-09 LAB — VANCOMYCIN, RANDOM: Vancomycin Rm: 19.1 ug/mL

## 2014-04-10 LAB — CBC
HCT: 29.9 % — ABNORMAL LOW (ref 36.0–46.0)
HEMOGLOBIN: 9.8 g/dL — AB (ref 12.0–15.0)
MCH: 28.2 pg (ref 26.0–34.0)
MCHC: 32.8 g/dL (ref 30.0–36.0)
MCV: 86.2 fL (ref 78.0–100.0)
Platelets: 73 10*3/uL — ABNORMAL LOW (ref 150–400)
RBC: 3.47 MIL/uL — ABNORMAL LOW (ref 3.87–5.11)
RDW: 18.6 % — ABNORMAL HIGH (ref 11.5–15.5)
WBC: 10.7 10*3/uL — ABNORMAL HIGH (ref 4.0–10.5)

## 2014-04-11 LAB — CBC
HCT: 29.8 % — ABNORMAL LOW (ref 36.0–46.0)
Hemoglobin: 9.8 g/dL — ABNORMAL LOW (ref 12.0–15.0)
MCH: 28.7 pg (ref 26.0–34.0)
MCHC: 32.9 g/dL (ref 30.0–36.0)
MCV: 87.1 fL (ref 78.0–100.0)
PLATELETS: 62 10*3/uL — AB (ref 150–400)
RBC: 3.42 MIL/uL — AB (ref 3.87–5.11)
RDW: 18.9 % — ABNORMAL HIGH (ref 11.5–15.5)
WBC: 9.9 10*3/uL (ref 4.0–10.5)

## 2014-04-12 LAB — CBC
HCT: 26 % — ABNORMAL LOW (ref 36.0–46.0)
Hemoglobin: 8.8 g/dL — ABNORMAL LOW (ref 12.0–15.0)
MCH: 28.7 pg (ref 26.0–34.0)
MCHC: 33.8 g/dL (ref 30.0–36.0)
MCV: 84.7 fL (ref 78.0–100.0)
PLATELETS: 63 10*3/uL — AB (ref 150–400)
RBC: 3.07 MIL/uL — ABNORMAL LOW (ref 3.87–5.11)
RDW: 18.6 % — AB (ref 11.5–15.5)
WBC: 4.6 10*3/uL (ref 4.0–10.5)

## 2014-04-12 LAB — RENAL FUNCTION PANEL
Albumin: 2.8 g/dL — ABNORMAL LOW (ref 3.5–5.2)
Anion gap: 10 (ref 5–15)
BUN: 42 mg/dL — AB (ref 6–23)
CO2: 23 mmol/L (ref 19–32)
CREATININE: 2.85 mg/dL — AB (ref 0.50–1.10)
Calcium: 9.4 mg/dL (ref 8.4–10.5)
Chloride: 105 mmol/L (ref 96–112)
GFR calc Af Amer: 18 mL/min — ABNORMAL LOW (ref >90)
GFR calc non Af Amer: 15 mL/min — ABNORMAL LOW (ref >90)
GLUCOSE: 130 mg/dL — AB (ref 70–99)
Phosphorus: 3.1 mg/dL (ref 2.3–4.6)
Potassium: 3.4 mmol/L — ABNORMAL LOW (ref 3.5–5.1)
Sodium: 138 mmol/L (ref 135–145)

## 2014-04-12 NOTE — Progress Notes (Addendum)
   Name: Mallory Pitts MRN: [id number] DOB: [birth year]    ADMISSION DATE:  03/25/2014 CONSULTATION DATE:  1/22 REFERRING MD :  Sharyon MedicusHijazi  CHIEF COMPLAINT:  Vent weaning   BRIEF PATIENT DESCRIPTION:  74 year old female [facility] w/ CC: abd pain and AMS. Found to have perf rectum and was in septic shock. Went to OR emergently for exploration, washout, repair and placement of colostomy. Course was complicated by on-going septic shock, peritoneal abscess which required perc drainage (since removed as was not draining; but abscess not totally resolved), DIC, sacral decub ulcer,  prolonged pressor requirements, AF w/ RVR and inability to wean. She underwent trach and PEG placement and was transferred [facility] for [facility] care  SIGNIFICANT EVENTS  expl lap 12/23 for per rectosig colon PEG 1/8>> Trach 1/8>> Perirectal abscess drain placed 1/28  STUDIES:   SUBJECTIVE:  Doing well.  Has been weaned from 28% FiO2 to 21%.  Trach downsized and now with #6 cuffless.  VITAL SIGNS:  VSS.   PHYSICAL EXAMINATION: General:  Chronically ill appearing female, on HD currently Neuro:  Awake, moves extremities, following commands. HEENT:  #6 cuffless trach in place. Cardiovascular:  RRR, no M/R/G. Lungs:  Decreased BS bilaterally, no W/R/R. Abdomen:  Colostomy intact, abd incision intact + bowel sounds  Musculoskeletal:  Generalized weakness  Skin:  Intact    Recent Labs Lab 04/07/14 1030 04/09/14 0330 04/12/14 0930  NA 139 136 138  K 3.1* 3.6 3.4*  CL 104 104 105  CO2 24 29 23   BUN 44* 32* 42*  CREATININE 2.58* 2.23* 2.85*  GLUCOSE 113* 145* 130*    Recent Labs Lab 04/10/14 0500 04/11/14 0705 04/12/14 0930  HGB 9.8* 9.8* 8.8*  HCT 29.9* 29.8* 26.0*  WBC 10.7* 9.9 4.6  PLT 73* 62* 63*   No results found.  ASSESSMENT / PLAN:  Acute respiratory failure  Obesity Tracheostomy status  S/p perf colon c/b peritonitis and intra-abd abscess: drain removed and  abscess improved but not resolved, new drain placed 1/28  S/p colostomy On-going septic shock as of 1/21 Anemia of critical illness Protein calorie malnutrition  ESRD  Acute encephalopathy  Deconditioning    Discussion  Has done well with trach downsizing and is progressing with weaning, now down to 21% FiO2.    Plan/rec Continue to wean per protocol. Speech to see for PMV and swallow eval. Accept MAP >55 Transfuse for Hgb < 7. Wound care and nutritional support per primary team.   Rutherford Guysahul Desai, PA - [facility] [place] [facility] Pgr: [phone]  or [phone]  Agree with above Has done well with downsizing trach, and tolerating trach collar. Can progress with Passy-Muir valve and swallow evaluation eventually IF  GI issues are resolved  ALVA,RAKESH V. md 04/12/2014, 11:55 AM

## 2014-04-14 LAB — CBC WITH DIFFERENTIAL/PLATELET
Basophils Absolute: 0 10*3/uL (ref 0.0–0.1)
Basophils Relative: 0 % (ref 0–1)
Eosinophils Absolute: 0 10*3/uL (ref 0.0–0.7)
Eosinophils Relative: 0 % (ref 0–5)
HCT: 29.8 % — ABNORMAL LOW (ref 36.0–46.0)
Hemoglobin: 9.8 g/dL — ABNORMAL LOW (ref 12.0–15.0)
Lymphocytes Relative: 22 % (ref 12–46)
Lymphs Abs: 0.6 10*3/uL — ABNORMAL LOW (ref 0.7–4.0)
MCH: 28.9 pg (ref 26.0–34.0)
MCHC: 32.9 g/dL (ref 30.0–36.0)
MCV: 87.9 fL (ref 78.0–100.0)
Monocytes Absolute: 0.3 10*3/uL (ref 0.1–1.0)
Monocytes Relative: 11 % (ref 3–12)
NEUTROS ABS: 1.9 10*3/uL (ref 1.7–7.7)
Neutrophils Relative %: 67 % (ref 43–77)
Platelets: 73 10*3/uL — ABNORMAL LOW (ref 150–400)
RBC: 3.39 MIL/uL — ABNORMAL LOW (ref 3.87–5.11)
RDW: 19.2 % — AB (ref 11.5–15.5)
WBC: 2.8 10*3/uL — AB (ref 4.0–10.5)

## 2014-04-14 LAB — RENAL FUNCTION PANEL
ALBUMIN: 3.2 g/dL — AB (ref 3.5–5.2)
Anion gap: 15 (ref 5–15)
BUN: 40 mg/dL — ABNORMAL HIGH (ref 6–23)
CHLORIDE: 101 mmol/L (ref 96–112)
CO2: 21 mmol/L (ref 19–32)
CREATININE: 2.52 mg/dL — AB (ref 0.50–1.10)
Calcium: 9.8 mg/dL (ref 8.4–10.5)
GFR calc Af Amer: 21 mL/min — ABNORMAL LOW (ref >90)
GFR, EST NON AFRICAN AMERICAN: 18 mL/min — AB (ref >90)
Glucose, Bld: 153 mg/dL — ABNORMAL HIGH (ref 70–99)
Phosphorus: 3 mg/dL (ref 2.3–4.6)
Potassium: 3.4 mmol/L — ABNORMAL LOW (ref 3.5–5.1)
Sodium: 137 mmol/L (ref 135–145)

## 2014-04-15 ENCOUNTER — Other Ambulatory Visit (HOSPITAL_COMMUNITY): Payer: Self-pay

## 2014-04-15 LAB — CBC WITH DIFFERENTIAL/PLATELET
BASOS ABS: 0 10*3/uL (ref 0.0–0.1)
Basophils Relative: 0 % (ref 0–1)
Eosinophils Absolute: 0 10*3/uL (ref 0.0–0.7)
Eosinophils Relative: 0 % (ref 0–5)
HEMATOCRIT: 28.8 % — AB (ref 36.0–46.0)
HEMOGLOBIN: 9.5 g/dL — AB (ref 12.0–15.0)
Lymphocytes Relative: 21 % (ref 12–46)
Lymphs Abs: 0.7 10*3/uL (ref 0.7–4.0)
MCH: 28.9 pg (ref 26.0–34.0)
MCHC: 33 g/dL (ref 30.0–36.0)
MCV: 87.5 fL (ref 78.0–100.0)
Monocytes Absolute: 0.7 10*3/uL (ref 0.1–1.0)
Monocytes Relative: 21 % — ABNORMAL HIGH (ref 3–12)
Neutro Abs: 1.8 10*3/uL (ref 1.7–7.7)
Neutrophils Relative %: 57 % (ref 43–77)
Platelets: 89 10*3/uL — ABNORMAL LOW (ref 150–400)
RBC: 3.29 MIL/uL — ABNORMAL LOW (ref 3.87–5.11)
RDW: 19.1 % — ABNORMAL HIGH (ref 11.5–15.5)
WBC: 3.2 10*3/uL — ABNORMAL LOW (ref 4.0–10.5)

## 2014-04-15 LAB — BASIC METABOLIC PANEL
ANION GAP: 10 (ref 5–15)
BUN: 25 mg/dL — ABNORMAL HIGH (ref 6–23)
CO2: 25 mmol/L (ref 19–32)
Calcium: 9.5 mg/dL (ref 8.4–10.5)
Chloride: 102 mmol/L (ref 96–112)
Creatinine, Ser: 1.75 mg/dL — ABNORMAL HIGH (ref 0.50–1.10)
GFR calc Af Amer: 32 mL/min — ABNORMAL LOW (ref >90)
GFR, EST NON AFRICAN AMERICAN: 28 mL/min — AB (ref >90)
Glucose, Bld: 116 mg/dL — ABNORMAL HIGH (ref 70–99)
POTASSIUM: 3.3 mmol/L — AB (ref 3.5–5.1)
Sodium: 137 mmol/L (ref 135–145)

## 2014-04-15 LAB — PTH, INTACT AND CALCIUM
Calcium, Total (PTH): 9.9 mg/dL (ref 8.7–10.3)
PTH: 303 pg/mL — ABNORMAL HIGH (ref 15–65)

## 2014-04-15 NOTE — Progress Notes (Signed)
   Name: Mallory Pitts MRN: [id number] DOB: Jul 15, 1940    ADMISSION DATE:  03/25/2014 CONSULTATION DATE:  1/22 REFERRING MD :  Sharyon MedicusHijazi  CHIEF COMPLAINT:  Vent weaning   BRIEF PATIENT DESCRIPTION:  74 year old [facility] w/ CC: abd pain and AMS. Found to have perf rectum and was in septic shock. Went to OR emergently for exploration, washout, repair and placement of colostomy. Course was complicated by on-going septic shock, peritoneal abscess which required perc drainage (since removed as was not draining; but abscess not totally resolved), DIC, sacral decub ulcer,  prolonged pressor requirements, AF w/ RVR and inability to wean. She underwent trach and PEG placement and was [facility]  SIGNIFICANT EVENTS  expl lap 12/23 for per rectosig colon PEG 1/8>> Trach 1/8>> Perirectal abscess drain placed 1/28  STUDIES: cap looks good, speaking well  SUBJECTIVE:  Doing well.  Has been weaned from 28% FiO2 to 21%.  Trach downsized and now with #6 cuffless.  VITAL SIGNS:  VSS.   PHYSICAL EXAMINATION: General: no distress Neuro:  Awake, moves extremities, following commands. HEENT:  #6 cuffless trach in place, cap Cardiovascular:  RRR, no M/R/G. Lungs: CTA Abdomen:  Colostomy intact, abd incision intact + bowel sounds  Musculoskeletal:  Generalized weakness  Skin:  Intact    Recent Labs Lab 04/12/14 0930 04/14/14 0700 04/15/14 0840  NA 138 137 137  K 3.4* 3.4* 3.3*  CL 105 101 102  CO2 23 21 25   BUN 42* 40* 25*  CREATININE 2.85* 2.52* 1.75*  GLUCOSE 130* 153* 116*    Recent Labs Lab 04/12/14 0930 04/14/14 0700 04/15/14 0840  HGB 8.8* 9.8* 9.5*  HCT 26.0* 29.8* 28.8*  WBC 4.6 2.8* 3.2*  PLT 63* 73* 89*   No results found.  ASSESSMENT / PLAN:  Acute respiratory failure  Obesity Tracheostomy status  S/p perf colon c/b peritonitis and intra-abd abscess: drain removed and abscess improved but not resolved, new drain  placed 1/28  S/p colostomy On-going septic shock as of 1/21 Anemia of critical illness Protein calorie malnutrition  ESRD  Acute encephalopathy  Deconditioning    Plan/rec she has been capped since am  She speaks well She coughs well Has progressed some on rehab mobility Eating well Strong overall de cannulate  Mcarthur Rossettianiel J. Tyson AliasFeinstein, MD, FACP Pgr: [phone] [place] [facility]

## 2014-04-16 ENCOUNTER — Other Ambulatory Visit (HOSPITAL_COMMUNITY): Payer: Self-pay

## 2014-04-16 ENCOUNTER — Encounter: Payer: Self-pay | Admitting: Radiology

## 2014-04-16 ENCOUNTER — Inpatient Hospital Stay
Admit: 2014-04-16 | Discharge: 2014-04-16 | Disposition: A | Discharge status: Home / Self Care | Payer: Self-pay | Attending: Nephrology | Admitting: Nephrology

## 2014-04-16 DIAGNOSIS — I82609 Acute embolism and thrombosis of unspecified veins of unspecified upper extremity: Secondary | ICD-10-CM | POA: Insufficient documentation

## 2014-04-16 DIAGNOSIS — T8249XA Other complication of vascular dialysis catheter, initial encounter: Secondary | ICD-10-CM | POA: Insufficient documentation

## 2014-04-16 LAB — RENAL FUNCTION PANEL
Albumin: 3 g/dL — ABNORMAL LOW (ref 3.5–5.2)
Anion gap: 10 (ref 5–15)
BUN: 39 mg/dL — ABNORMAL HIGH (ref 6–23)
CALCIUM: 9.5 mg/dL (ref 8.4–10.5)
CHLORIDE: 101 mmol/L (ref 96–112)
CO2: 26 mmol/L (ref 19–32)
Creatinine, Ser: 2.63 mg/dL — ABNORMAL HIGH (ref 0.50–1.10)
GFR calc Af Amer: 20 mL/min — ABNORMAL LOW (ref >90)
GFR, EST NON AFRICAN AMERICAN: 17 mL/min — AB (ref >90)
Glucose, Bld: 165 mg/dL — ABNORMAL HIGH (ref 70–99)
Phosphorus: 3.3 mg/dL (ref 2.3–4.6)
Potassium: 3.3 mmol/L — ABNORMAL LOW (ref 3.5–5.1)
SODIUM: 137 mmol/L (ref 135–145)

## 2014-04-16 LAB — CBC
HCT: 29.7 % — ABNORMAL LOW (ref 36.0–46.0)
HEMOGLOBIN: 9.9 g/dL — AB (ref 12.0–15.0)
MCH: 28.7 pg (ref 26.0–34.0)
MCHC: 33.3 g/dL (ref 30.0–36.0)
MCV: 86.1 fL (ref 78.0–100.0)
PLATELETS: 99 10*3/uL — AB (ref 150–400)
RBC: 3.45 MIL/uL — AB (ref 3.87–5.11)
RDW: 18.9 % — ABNORMAL HIGH (ref 11.5–15.5)
WBC: 3.4 10*3/uL — AB (ref 4.0–10.5)

## 2014-04-16 MED ORDER — HEPARIN SODIUM (PORCINE) 1000 UNIT/ML IJ SOLN
INTRAMUSCULAR | Status: AC
  Filled 2014-04-16: qty 1

## 2014-04-16 MED ORDER — MIDAZOLAM HCL 2 MG/2ML IJ SOLN
INTRAMUSCULAR | Status: AC
  Filled 2014-04-16: qty 2

## 2014-04-16 MED ORDER — LIDOCAINE HCL 1 % IJ SOLN
INTRAMUSCULAR | Status: AC
  Filled 2014-04-16: qty 20

## 2014-04-16 MED ORDER — MIDAZOLAM HCL 2 MG/2ML IJ SOLN
INTRAMUSCULAR | Status: AC | PRN
  Administered 2014-04-16: 1 mg via INTRAVENOUS

## 2014-04-16 MED ORDER — HEPARIN SODIUM (PORCINE) 1000 UNIT/ML IJ SOLN
INTRAMUSCULAR | Status: AC | PRN
  Administered 2014-04-16: 3000 [IU] via INTRAVENOUS

## 2014-04-16 MED ORDER — FENTANYL CITRATE 0.05 MG/ML IJ SOLN
INTRAMUSCULAR | Status: AC
  Filled 2014-04-16: qty 2

## 2014-04-16 MED ORDER — IOHEXOL 300 MG/ML  SOLN
100.0000 mL | Freq: Once | INTRAMUSCULAR | Status: AC | PRN
  Administered 2014-04-16: 50 mL via INTRAVENOUS

## 2014-04-16 MED ORDER — FENTANYL CITRATE 0.05 MG/ML IJ SOLN
INTRAMUSCULAR | Status: AC | PRN
  Administered 2014-04-16: 50 ug via INTRAVENOUS

## 2014-04-16 MED ORDER — ALTEPLASE 100 MG IV SOLR
2.0000 mg | Freq: Once | INTRAVENOUS | Status: AC
Start: 2014-04-16 — End: 2014-04-16
  Administered 2014-04-16: 2 mg
  Filled 2014-04-16: qty 2

## 2014-04-16 NOTE — Sedation Documentation (Signed)
Patient denies pain and is resting comfortably.  

## 2014-04-16 NOTE — H&P (Signed)
Reason for Consult: LUE AVF clotted  Referring Physician(s): [facility] Dr. Sharyon Medicus  History of Present Illness: Mallory Pitts is a 74 y.o. female with ESRD on HD MWF left AVF placed 04/25/10, last fistulogram 03/22/11 widely patent and last used 02/22/14 with success. The patient was admitted to [facility] on 12/23 found to have perforated rectum and went into septic shock s/p emergent OR for Ex Lap, repair and colostomy placement also with acute respiratory failure s/p tracheostomy which has been decannulated and patient is doing well. The patient has been receiving HD through left IJ temp cath placed at [facility] with no known problems to existing fistula. IR received request for Left arm AVF declot that hasnt been used since 12/21. She denies any active bleeding, she denies any history of hemorrhagic CVA.  PMHX: Perforated rectum s/p ex lap and colostomy, DIC, AFib with RVR, ESRD on HD  Surgical Hx: Emergent Ex Lap with colostomy, LUE AVF 04/25/2010   Allergies: NKDA  Medications: Prior to Admission medications   Not on File     History reviewed. No pertinent family history.  History   Social History  . Marital Status: Widowed    Spouse Name: N/A  . Number of Children: N/A  . Years of Education: N/A   Social History Main Topics  . Smoking status: Not on file  . Smokeless tobacco: Not on file  . Alcohol Use: Not on file  . Drug Use: Not on file  . Sexual Activity: Not on file   Other Topics Concern  . None   Social History Narrative  . None    Review of Systems: A 12 point ROS discussed and pertinent positives are indicated in the HPI above.  All other systems are negative.  Review of Systems  Vital Signs: T: 98.18F, Hr: 75bpm, BP: 132/74 mmHg, 99%RA  Physical Exam General: Alert, awake, NAD, sitting up in chair having HD via left IJ temp Heart: RRR without M/G/R Lungs: CTA b/l Ext: LUE AVF no thrill, arterial brachial pulse felt  Mallampati Score:  MD  Evaluation Airway: WNL Airway comments: trach Heart: WNL Abdomen: WNL Chest/ Lungs: WNL ASA  Classification: 3 Mallampati/Airway Score: Two  Imaging: Ct Pelvis Wo Contrast  04/01/2014   CLINICAL DATA:  perf rectum and was in septic shock. Went to OR emergently for exploration, washout, repair and placement of colostomy. Course was complicated by on-going septic shock, peritoneal abscess which required perc drainage (since removed as was not draining; but abscess not totally resolved), DIC, sacral decub ulcer, prolonged pressor requirements, AF w/ RVR and inability to wean. She underwent trach and PEG placement and was transferred to [facility] for further care. Recent CT demonstrates a right anterior subcutaneous loculated collection, a small loculated gas collection in the anterior peritoneal cavity, and a complex collection adjacent to or contiguous with the rectal stump. Percutaneous drainage is requested.  EXAM: CT PELVIS WITHOUT CONTRAST  TECHNIQUE: Patient was placed in right lateral decubitus positioning in preparation for possible drain placement.Multidetector CT imaging of the pelvis was performed following the standard protocol without intravenous contrast.  COMPARISON:  CT 03/30/2014  FINDINGS: The collection seen near the rectum on the prior study is more clearly contiguous with the lumen, which extends along the left pelvic sidewall towards the left anterior pelvis. No definite extraluminal collection is identified . A gas bubble projecting anterior to the uterine fundus on the left in an extension of the lumen of the urinary bladder, which is decompressed  by Foley catheter. Visualized loops of small bowel and colon are nondilated. No convincing extraluminal gas or fluid collection in the pelvis indicating a need for percutaneous drain placement.  Anterior sutures from laparoscopic mesh placement along the anterior body wall. Left lower quadrant ostomy. No free air. No definite ascites. Extensive  edematous changes in the body wall and extraperitoneal planes of the pelvis. Surgical clips around the distal rectum. Patchy aortoiliac arterial plaque. Stable compression fracture deformity of L4 with diffuse sclerosis in the vertebral body and pedicles. There is a coarse mottled mixed lytic and sclerotic appearance throughout regions of the pelvis and proximal right femur as before.  IMPRESSION: 1. No definite pelvic abscess collection as detailed above. Percutaneous drain catheter placement was deferred. 2. Stable osseous lesions of the pelvis, right hip, and L4 with compression fracture deformity. Favor renal osteodystrophy over metastatic disease ; bone scintigraphy may be useful for differentiation if this is of clinical significance.   Electronically Signed   By: Oley Balm M.D.   On: 04/01/2014 14:14   Ct Abdomen Pelvis W Contrast  03/30/2014   CLINICAL DATA:  Rectal perforation 02/24/2014. Intra-abdominal abscess.  EXAM: CT ABDOMEN AND PELVIS WITH CONTRAST  TECHNIQUE: Multidetector CT imaging of the abdomen and pelvis was performed using the standard protocol following bolus administration of intravenous contrast.  CONTRAST:  OMNIPAQUE IOHEXOL 300 MG/ML  SOLN  COMPARISON:  None.  FINDINGS: Lower chest: Small bilateral effusions. There is lower lobe consolidation with air bronchograms, right greater than left.  Hepatobiliary: There is a 17 mm left hepatic lobe cyst. No other focal liver lesions. Gallbladder contains vicarious contrast and probable calculus. No bile duct dilatation.  Pancreas: There is a 2.5 cm indeterminate pancreatic tail cyst. Question an additional 1.7 cm pancreatic head cyst. Neither of these is characterized.  Spleen: Unremarkable  Adrenals/Urinary Tract: The adrenals appear normal. There are cysts about both kidneys, as well as renal atrophy. The largest cyst measures 2.9 cm in the right lower pole and appears somewhat complex, not characterized.  Stomach/Bowel: Stomach  and small bowel appear unremarkable. There is a distal descending colostomy in the low midline. There is a rectal stump.  Vascular/Lymphatic: Mildly prominent retroperitoneal and mesenteric nodes are present. The largest retroperitoneal node is in the left para-aortic region below the left renal vein, measuring 1.7 cm. These nodes may be reactive.  Reproductive: Unremarkable  Other: There is a 2.6 by 6.6 cm subcutaneous collection in the low anterior abdomen just to the right of midline, slightly below the ostomy. At the level of the ostomy there is a 2.1 x 3.1 cm collection of extraluminal air in the peritoneum, visible on image 82 series 3. This presumably represents residual abscess cavity. Directly anterior to the rectal stump there is a 3.7 x 4.5 cm collection which is indeterminate. This could represent another abscess. It may represent stool within the rectal stump as the boundaries of the rectal stump are poorly defined.  There is extensive subcutaneous edema consistent with anasarca. There also is edema throughout the mesentery and small volume ascites in the pericolic gutters.  * Musculoskeletal: Poor bone density. Marked sclerosis of many of the bones indeterminate pancreatic cyst,, not characterized. 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  IMPRESSION: *2.6 x 6.6 cm subcutaneous collection in the anterior abdomen just below and to the right of the ostomy, likely abscess in this clinical setting *2.1 x 3.1 cm collection of extraluminal air and in the anterior peritoneum at the level of the ostomy, likely  residual abscess cavity *3.7 x 4.57 cm collection adjacent to the rectal stump, indeterminate with regard to rectal stump contents versus adjacent abscess. *Extensive anasarca, mesenteric edema, small volume ascites *Indeterminate pancreatic cysts measuring at 2.5 cm. *Indeterminate renal cysts *If outside CT scans can be made available, we will compare and provide an addendum   Electronically Signed   By: Ellery Plunk M.D.   On: 03/30/2014 22:30   Dg Chest Port 1 View  03/28/2014   CLINICAL DATA:  Pleural effusion  EXAM: PORTABLE CHEST - 1 VIEW  COMPARISON:  03/26/2014  FINDINGS: Tracheostomy tube as well as left-sided central venous lines are again seen and stable. Cardiac shadow is within normal limits. Left basilar atelectatic changes/early infiltrate are noted. No sizable effusion is seen. No bony abnormality is noted.  IMPRESSION: Left basilar changes.   Electronically Signed   By: Alcide Clever M.D.   On: 03/28/2014 12:57   Dg Chest Port 1 View  03/26/2014   CLINICAL DATA:  Respiratory failure  EXAM: PORTABLE CHEST - 1 VIEW  COMPARISON:  None.  FINDINGS: Split tip catheter from left IJ approach is at the SVC level. Left subclavian central line is also at the SVC level. There is a tracheostomy tube which is well seated.  Hazy appearance of the mid and lower chest which could reflect effusions, atelectasis, or consolidation. No evidence for pulmonary edema in the upper lungs. No pneumothorax. Heart size and aortic contours are normal for technique.  IMPRESSION: 1. Left-sided catheter are in good position. 2. Poor inflation with bibasilar atelectasis, effusions, or pneumonia.   Electronically Signed   By: Tiburcio Pea M.D.   On: 03/26/2014 06:45   Dg Abd Portable 1v  03/25/2014   CLINICAL DATA:  Enteric tube placement.  EXAM: PORTABLE ABDOMEN - 1 VIEW  COMPARISON:  None  FINDINGS: There is a gastrostomy tube that projects within the mid stomach. Bowel gas pattern is unremarkable. There are surgical vascular clips superimposed of the pelvis.  IMPRESSION: Gastrostomy tube projects within the mid stomach   Electronically Signed   By: Amie Portland M.D.   On: 03/25/2014 21:39    Labs:  CBC:  Recent Labs  04/12/14 0930 04/14/14 0700 04/15/14 0840 04/16/14 0840  WBC 4.6 2.8* 3.2* 3.4*  HGB 8.8* 9.8* 9.5* 9.9*  HCT 26.0* 29.8* 28.8* 29.7*  PLT 63* 73* 89* 99*    COAGS:  Recent Labs   04/01/14 0500  INR 1.41  APTT 32    BMP:  Recent Labs  04/12/14 0930 04/14/14 0700 04/15/14 0840 04/16/14 0840  NA 138 137 137 137  K 3.4* 3.4* 3.3* 3.3*  CL 105 101 102 101  CO2 23 21 25 26   GLUCOSE 130* 153* 116* 165*  BUN 42* 40* 25* 39*  CALCIUM 9.4 9.8  9.9 9.5 9.5  CREATININE 2.85* 2.52* 1.75* 2.63*  GFRNONAA 15* 18* 28* 17*  GFRAA 18* 21* 32* 20*    LIVER FUNCTION TESTS:  Recent Labs  03/26/14 0500  04/09/14 0330 04/12/14 0930 04/14/14 0700 04/16/14 0840  BILITOT 1.1  --   --   --   --   --   AST 22  --   --   --   --   --   ALT 7  --   --   --   --   --   ALKPHOS 100  --   --   --   --   --   PROT  6.3  --   --   --   --   --   ALBUMIN 2.6*  < > 2.7* 2.8* 3.2* 3.0*  < > = values in this interval not displayed.  Assessment and Plan: ESRD on HD MWF left AVF placed 04/25/10, last fistulogram 03/22/11 widely patent and last used 02/22/14 with success Ad[facility]23 found to have perforated rectum and went into septic shock s/p emergent OR for Ex Lap, repair and colostomy placement History of DIC Sacral decub ulcer Afib with RVR S/p PEG 1/8 S/p Tracheostomy 1/8, decannulated and doing well.  [facility] 03/25/14, weaned and trach downsized  Request for Left arm AVF declot, hasnt been used since 12/21, [facility] Patient has been NPO since 0800, afebrile, labs reviewed, no thinners except with HD sessions. Risks and Benefits discussed with the patient and her patient's sister over the phone. All questions were answered, patient and her sister are agreeable to proceed. Consent signed and in chart.    Thank you for this interesting consult.  I greatly enjoyed meeting Liana Crockernnie Musson and look forward to participating in their care.  SignedBerneta Levins: Chrishawn Kring D 04/16/2014, 3:26 PM   I spent a total of 40 Minutes in face to face in clinical consultation, greater than 50% of which was  counseling/coordinating care for left arm AVF Declot

## 2014-04-16 NOTE — Procedures (Signed)
Declot L UA fistula 5mm PTA art anast No complication No blood loss. See complete dictation in [facility] P[facility].

## 2014-04-17 ENCOUNTER — Other Ambulatory Visit (HOSPITAL_COMMUNITY): Payer: Self-pay

## 2014-04-17 LAB — BASIC METABOLIC PANEL
Anion gap: 8 (ref 5–15)
BUN: 27 mg/dL — ABNORMAL HIGH (ref 6–23)
CHLORIDE: 102 mmol/L (ref 96–112)
CO2: 27 mmol/L (ref 19–32)
CREATININE: 1.98 mg/dL — AB (ref 0.50–1.10)
Calcium: 9.3 mg/dL (ref 8.4–10.5)
GFR calc non Af Amer: 24 mL/min — ABNORMAL LOW (ref >90)
GFR, EST AFRICAN AMERICAN: 28 mL/min — AB (ref >90)
Glucose, Bld: 188 mg/dL — ABNORMAL HIGH (ref 70–99)
Potassium: 3.4 mmol/L — ABNORMAL LOW (ref 3.5–5.1)
Sodium: 137 mmol/L (ref 135–145)

## 2014-04-18 ENCOUNTER — Other Ambulatory Visit (HOSPITAL_COMMUNITY): Payer: Self-pay

## 2014-04-19 LAB — BASIC METABOLIC PANEL
Anion gap: 12 (ref 5–15)
BUN: 53 mg/dL — ABNORMAL HIGH (ref 6–23)
CO2: 24 mmol/L (ref 19–32)
Calcium: 10.2 mg/dL (ref 8.4–10.5)
Chloride: 102 mmol/L (ref 96–112)
Creatinine, Ser: 3.13 mg/dL — ABNORMAL HIGH (ref 0.50–1.10)
GFR, EST AFRICAN AMERICAN: 16 mL/min — AB (ref >90)
GFR, EST NON AFRICAN AMERICAN: 14 mL/min — AB (ref >90)
Glucose, Bld: 136 mg/dL — ABNORMAL HIGH (ref 70–99)
POTASSIUM: 4.8 mmol/L (ref 3.5–5.1)
Sodium: 138 mmol/L (ref 135–145)

## 2014-04-19 LAB — RENAL FUNCTION PANEL
Albumin: 3.4 g/dL — ABNORMAL LOW (ref 3.5–5.2)
Anion gap: 11 (ref 5–15)
BUN: 54 mg/dL — ABNORMAL HIGH (ref 6–23)
CALCIUM: 10.1 mg/dL (ref 8.4–10.5)
CHLORIDE: 102 mmol/L (ref 96–112)
CO2: 24 mmol/L (ref 19–32)
Creatinine, Ser: 3.07 mg/dL — ABNORMAL HIGH (ref 0.50–1.10)
GFR calc Af Amer: 16 mL/min — ABNORMAL LOW (ref >90)
GFR calc non Af Amer: 14 mL/min — ABNORMAL LOW (ref >90)
GLUCOSE: 135 mg/dL — AB (ref 70–99)
POTASSIUM: 4.7 mmol/L (ref 3.5–5.1)
Phosphorus: 3.2 mg/dL (ref 2.3–4.6)
SODIUM: 137 mmol/L (ref 135–145)

## 2014-04-19 LAB — CBC
HCT: 32.5 % — ABNORMAL LOW (ref 36.0–46.0)
Hemoglobin: 10.6 g/dL — ABNORMAL LOW (ref 12.0–15.0)
MCH: 28.7 pg (ref 26.0–34.0)
MCHC: 32.6 g/dL (ref 30.0–36.0)
MCV: 88.1 fL (ref 78.0–100.0)
Platelets: 135 10*3/uL — ABNORMAL LOW (ref 150–400)
RBC: 3.69 MIL/uL — AB (ref 3.87–5.11)
RDW: 19.7 % — AB (ref 11.5–15.5)
WBC: 2.3 10*3/uL — AB (ref 4.0–10.5)

## 2014-04-20 NOTE — Progress Notes (Signed)
Name: Mallory Pitts MRN: [id number] DOB: 04/29/1940    ADMISSION DATE:  03/25/2014 CONSULTATION DATE:  1/22 REFERRING MD :  Sharyon Medicus  CHIEF COMPLAINT:  Vent weaning   BRIEF PATIENT DESCRIPTION:  74 year old female admitted to [facility] on 12/23 w/ CC: abd pain and AMS. Found to have perf rectum and was in septic shock. Went to OR emergently for exploration, washout, repair and placement of colostomy. Course was complicated by on-going septic shock, peritoneal abscess which required perc drainage (since removed as was not draining; but abscess not totally resolved), DIC, sacral decub ulcer,  prolonged pressor requirements, AF w/ RVR and inability to wean. She underwent trach and PEG placement and was transferred to [facility] for further care  SIGNIFICANT EVENTS  expl lap 12/23 for per rectosig colon PEG 1/8>> Trach 1/8>> Perirectal abscess drain placed 1/28 decannulated 2/11  SUBJECTIVE:  Denies chest pain or dyspnea.   Vitals reviewed in bedside chart  PHYSICAL EXAMINATION: General: no distress Neuro:  Awake, moves extremities, following commands. HEENT:  Stoma healing Cardiovascular:  RRR, no M/R/G. Lungs: CTA Abdomen:  Colostomy intact, abd incision intact + bowel sounds  Musculoskeletal:  Generalized weakness  Skin:  Intact    Recent Labs Lab 04/16/14 0840 04/17/14 0542 04/19/14 0929  NA 137 137 138  137  K 3.3* 3.4* 4.8  4.7  CL 101 102 102  102  CO2 BUN 39* 27* 53*  54*  CREATININE 2.63* 1.98* 3.13*  3.07*  GLUCOSE 165* 188* 136*  135*    Recent Labs Lab 04/15/14 0840 04/16/14 0840 04/19/14 0929  HGB 9.5* 9.9* 10.6*  HCT 28.8* 29.7* 32.5*  WBC 3.2* 3.4* 2.3*  PLT 89* 99* 135*   Ct Head Wo Contrast  04/18/2014   CLINICAL DATA:  74 year old female with acute fall, head and neck injury, headache and neck pain. Initial encounter.  EXAM: CT HEAD WITHOUT CONTRAST  CT CERVICAL SPINE WITHOUT CONTRAST  TECHNIQUE: Multidetector CT imaging of  the head and cervical spine was performed following the standard protocol without intravenous contrast. Multiplanar CT image reconstructions of the cervical spine were also generated.  COMPARISON:  04/17/2014 head CT  FINDINGS: CT HEAD FINDINGS  Moderate chronic small-vessel white matter ischemic changes are identified.  No acute intracranial abnormalities are identified, including mass lesion or mass effect, hydrocephalus, extra-axial fluid collection, midline shift, hemorrhage, or acute infarction. The visualized bony calvarium is unremarkable.  Bilateral mastoid effusions are again noted.  CT CERVICAL SPINE FINDINGS  There is no evidence of acute fracture, subluxation or prevertebral soft tissue swelling.  Moderate multilevel degenerative disc disease and facet arthropathy noted.  No focal bony lesions are identified.  The soft tissue structures are unremarkable.  IMPRESSION: No evidence of acute intracranial abnormality.  No static evidence of acute injury to the cervical spine.  Chronic small-vessel white matter ischemic changes.  Bilateral mastoid effusions again noted   Electronically Signed   By: Harmon Pier M.D.   On: 04/18/2014 17:33   Ct Cervical Spine Wo Contrast  04/18/2014   CLINICAL DATA:  74 year old female with acute fall, head and neck injury, headache and neck pain. Initial encounter.  EXAM: CT HEAD WITHOUT CONTRAST  CT CERVICAL SPINE WITHOUT CONTRAST  TECHNIQUE: Multidetector CT imaging of the head and cervical spine was performed following the standard protocol without intravenous contrast. Multiplanar CT image reconstructions of the cervical spine were also generated.  COMPARISON:  04/17/2014 head CT  FINDINGS:  CT HEAD FINDINGS  Moderate chronic small-vessel white matter ischemic changes are identified.  No acute intracranial abnormalities are identified, including mass lesion or mass effect, hydrocephalus, extra-axial fluid collection, midline shift, hemorrhage, or acute infarction. The  visualized bony calvarium is unremarkable.  Bilateral mastoid effusions are again noted.  CT CERVICAL SPINE FINDINGS  There is no evidence of acute fracture, subluxation or prevertebral soft tissue swelling.  Moderate multilevel degenerative disc disease and facet arthropathy noted.  No focal bony lesions are identified.  The soft tissue structures are unremarkable.  IMPRESSION: No evidence of acute intracranial abnormality.  No static evidence of acute injury to the cervical spine.  Chronic small-vessel white matter ischemic changes.  Bilateral mastoid effusions again noted   Electronically Signed   By: Harmon PierJeffrey  Hu M.D.   On: 04/18/2014 17:33    ASSESSMENT:  Acute respiratory failure  Obesity Tracheostomy status  S/p perf colon c/b peritonitis and intra-abd abscess: drain removed and abscess improved but not resolved, new drain placed 1/28  S/p colostomy On-going septic shock as of 1/21 Anemia of critical illness Protein calorie malnutrition  ESRD  Acute encephalopathy  Deconditioning    PLAN:  PCCM will sign off.  Coralyn HellingVineet Ellon Marasco, MD [facility] 04/20/2014, 11:07 AM Pager:  [phone] After 3pm call: [phone]

## 2014-04-21 LAB — RENAL FUNCTION PANEL
ALBUMIN: 2.9 g/dL — AB (ref 3.5–5.2)
ALBUMIN: 3 g/dL — AB (ref 3.5–5.2)
Anion gap: 10 (ref 5–15)
Anion gap: 8 (ref 5–15)
BUN: 39 mg/dL — ABNORMAL HIGH (ref 6–23)
BUN: 16 mg/dL (ref 6–23)
CALCIUM: 9.5 mg/dL (ref 8.4–10.5)
CALCIUM: 9.5 mg/dL (ref 8.4–10.5)
CO2: 23 mmol/L (ref 19–32)
CO2: 27 mmol/L (ref 19–32)
CREATININE: 1.73 mg/dL — AB (ref 0.50–1.10)
Chloride: 102 mmol/L (ref 96–112)
Chloride: 101 mmol/L (ref 96–112)
Creatinine, Ser: 2.91 mg/dL — ABNORMAL HIGH (ref 0.50–1.10)
GFR calc Af Amer: 33 mL/min — ABNORMAL LOW (ref >90)
GFR calc non Af Amer: 28 mL/min — ABNORMAL LOW (ref >90)
GFR, EST AFRICAN AMERICAN: 17 mL/min — AB (ref >90)
GFR, EST NON AFRICAN AMERICAN: 15 mL/min — AB (ref >90)
Glucose, Bld: 154 mg/dL — ABNORMAL HIGH (ref 70–99)
Glucose, Bld: 197 mg/dL — ABNORMAL HIGH (ref 70–99)
Phosphorus: 3.4 mg/dL (ref 2.3–4.6)
Phosphorus: 2.4 mg/dL (ref 2.3–4.6)
Potassium: 4.5 mmol/L (ref 3.5–5.1)
Potassium: 3.9 mmol/L (ref 3.5–5.1)
SODIUM: 135 mmol/L (ref 135–145)
Sodium: 136 mmol/L (ref 135–145)

## 2014-04-21 LAB — CBC
HEMATOCRIT: 27.9 % — AB (ref 36.0–46.0)
HEMOGLOBIN: 9 g/dL — AB (ref 12.0–15.0)
MCH: 29 pg (ref 26.0–34.0)
MCHC: 32.3 g/dL (ref 30.0–36.0)
MCV: 90 fL (ref 78.0–100.0)
PLATELETS: 121 10*3/uL — AB (ref 150–400)
RBC: 3.1 MIL/uL — ABNORMAL LOW (ref 3.87–5.11)
RDW: 19.7 % — ABNORMAL HIGH (ref 11.5–15.5)
WBC: 2.5 10*3/uL — AB (ref 4.0–10.5)

## 2014-04-22 LAB — CBC
HCT: 30.8 % — ABNORMAL LOW (ref 36.0–46.0)
HEMOGLOBIN: 10 g/dL — AB (ref 12.0–15.0)
MCH: 28.9 pg (ref 26.0–34.0)
MCHC: 32.5 g/dL (ref 30.0–36.0)
MCV: 89 fL (ref 78.0–100.0)
PLATELETS: 144 10*3/uL — AB (ref 150–400)
RBC: 3.46 MIL/uL — AB (ref 3.87–5.11)
RDW: 19.6 % — ABNORMAL HIGH (ref 11.5–15.5)
WBC: 3 10*3/uL — AB (ref 4.0–10.5)

## 2014-04-24 ENCOUNTER — Other Ambulatory Visit (HOSPITAL_COMMUNITY): Payer: Self-pay

## 2014-04-25 ENCOUNTER — Other Ambulatory Visit (HOSPITAL_COMMUNITY): Payer: Self-pay

## 2014-04-25 LAB — HEPATIC FUNCTION PANEL
ALBUMIN: 3 g/dL — AB (ref 3.5–5.2)
ALT: 16 U/L (ref 0–35)
AST: 23 U/L (ref 0–37)
Alkaline Phosphatase: 101 U/L (ref 39–117)
BILIRUBIN DIRECT: 0.3 mg/dL (ref 0.0–0.5)
BILIRUBIN TOTAL: 0.9 mg/dL (ref 0.3–1.2)
Indirect Bilirubin: 0.6 mg/dL (ref 0.3–0.9)
Total Protein: 5.9 g/dL — ABNORMAL LOW (ref 6.0–8.3)

## 2014-04-25 LAB — CLOSTRIDIUM DIFFICILE BY PCR: Toxigenic C. Difficile by PCR: POSITIVE — AB

## 2014-04-26 LAB — RENAL FUNCTION PANEL
Albumin: 2.7 g/dL — ABNORMAL LOW (ref 3.5–5.2)
Anion gap: 10 (ref 5–15)
BUN: 46 mg/dL — AB (ref 6–23)
CO2: 23 mmol/L (ref 19–32)
Calcium: 9.3 mg/dL (ref 8.4–10.5)
Chloride: 103 mmol/L (ref 96–112)
Creatinine, Ser: 4.17 mg/dL — ABNORMAL HIGH (ref 0.50–1.10)
GFR, EST AFRICAN AMERICAN: 11 mL/min — AB (ref >90)
GFR, EST NON AFRICAN AMERICAN: 10 mL/min — AB (ref >90)
Glucose, Bld: 122 mg/dL — ABNORMAL HIGH (ref 70–99)
POTASSIUM: 4.1 mmol/L (ref 3.5–5.1)
Phosphorus: 3.8 mg/dL (ref 2.3–4.6)
Sodium: 136 mmol/L (ref 135–145)

## 2014-04-26 LAB — CBC
HCT: 31.5 % — ABNORMAL LOW (ref 36.0–46.0)
Hemoglobin: 10.2 g/dL — ABNORMAL LOW (ref 12.0–15.0)
MCH: 28.8 pg (ref 26.0–34.0)
MCHC: 32.4 g/dL (ref 30.0–36.0)
MCV: 89 fL (ref 78.0–100.0)
Platelets: 138 10*3/uL — ABNORMAL LOW (ref 150–400)
RBC: 3.54 MIL/uL — ABNORMAL LOW (ref 3.87–5.11)
RDW: 19.6 % — ABNORMAL HIGH (ref 11.5–15.5)
WBC: 21.1 10*3/uL — ABNORMAL HIGH (ref 4.0–10.5)

## 2014-04-27 ENCOUNTER — Other Ambulatory Visit (HOSPITAL_COMMUNITY): Payer: Self-pay

## 2014-04-27 ENCOUNTER — Encounter: Payer: Self-pay | Admitting: Radiology

## 2014-04-27 LAB — HEPATITIS B SURFACE ANTIGEN: HEP B S AG: NEGATIVE

## 2014-04-27 MED ORDER — IOHEXOL 300 MG/ML  SOLN
100.0000 mL | Freq: Once | INTRAMUSCULAR | Status: AC | PRN
  Administered 2014-04-27: 100 mL via INTRAVENOUS

## 2014-04-28 LAB — RENAL FUNCTION PANEL
ANION GAP: 11 (ref 5–15)
Albumin: 2.3 g/dL — ABNORMAL LOW (ref 3.5–5.2)
BUN: 35 mg/dL — ABNORMAL HIGH (ref 6–23)
CHLORIDE: 101 mmol/L (ref 96–112)
CO2: 27 mmol/L (ref 19–32)
Calcium: 9.2 mg/dL (ref 8.4–10.5)
Creatinine, Ser: 3.42 mg/dL — ABNORMAL HIGH (ref 0.50–1.10)
GFR, EST AFRICAN AMERICAN: 14 mL/min — AB (ref >90)
GFR, EST NON AFRICAN AMERICAN: 12 mL/min — AB (ref >90)
Glucose, Bld: 88 mg/dL (ref 70–99)
Phosphorus: 3.4 mg/dL (ref 2.3–4.6)
Potassium: 3.8 mmol/L (ref 3.5–5.1)
SODIUM: 139 mmol/L (ref 135–145)

## 2014-04-28 LAB — CBC
HEMATOCRIT: 25.4 % — AB (ref 36.0–46.0)
Hemoglobin: 8.2 g/dL — ABNORMAL LOW (ref 12.0–15.0)
MCH: 29.1 pg (ref 26.0–34.0)
MCHC: 32.3 g/dL (ref 30.0–36.0)
MCV: 90.1 fL (ref 78.0–100.0)
Platelets: 116 10*3/uL — ABNORMAL LOW (ref 150–400)
RBC: 2.82 MIL/uL — ABNORMAL LOW (ref 3.87–5.11)
RDW: 19.5 % — AB (ref 11.5–15.5)
WBC: 8.8 10*3/uL (ref 4.0–10.5)

## 2014-04-29 LAB — CULTURE, BLOOD (ROUTINE X 2)

## 2014-04-30 ENCOUNTER — Other Ambulatory Visit (HOSPITAL_COMMUNITY): Payer: Self-pay

## 2014-04-30 LAB — CBC
HCT: 26.8 % — ABNORMAL LOW (ref 36.0–46.0)
Hemoglobin: 8.7 g/dL — ABNORMAL LOW (ref 12.0–15.0)
MCH: 29.2 pg (ref 26.0–34.0)
MCHC: 32.5 g/dL (ref 30.0–36.0)
MCV: 89.9 fL (ref 78.0–100.0)
PLATELETS: 100 10*3/uL — AB (ref 150–400)
RBC: 2.98 MIL/uL — AB (ref 3.87–5.11)
RDW: 18.7 % — ABNORMAL HIGH (ref 11.5–15.5)
WBC: 7.1 10*3/uL (ref 4.0–10.5)

## 2014-05-03 LAB — RENAL FUNCTION PANEL
ALBUMIN: 2.5 g/dL — AB (ref 3.5–5.2)
Anion gap: 11 (ref 5–15)
BUN: 32 mg/dL — AB (ref 6–23)
CALCIUM: 8.8 mg/dL (ref 8.4–10.5)
CO2: 25 mmol/L (ref 19–32)
CREATININE: 3.47 mg/dL — AB (ref 0.50–1.10)
Chloride: 96 mmol/L (ref 96–112)
GFR calc Af Amer: 14 mL/min — ABNORMAL LOW (ref >90)
GFR, EST NON AFRICAN AMERICAN: 12 mL/min — AB (ref >90)
Glucose, Bld: 119 mg/dL — ABNORMAL HIGH (ref 70–99)
PHOSPHORUS: 4 mg/dL (ref 2.3–4.6)
Potassium: 4 mmol/L (ref 3.5–5.1)
Sodium: 132 mmol/L — ABNORMAL LOW (ref 135–145)

## 2014-05-03 LAB — CBC
HCT: 24.6 % — ABNORMAL LOW (ref 36.0–46.0)
Hemoglobin: 8.1 g/dL — ABNORMAL LOW (ref 12.0–15.0)
MCH: 29.2 pg (ref 26.0–34.0)
MCHC: 32.9 g/dL (ref 30.0–36.0)
MCV: 88.8 fL (ref 78.0–100.0)
Platelets: 130 10*3/uL — ABNORMAL LOW (ref 150–400)
RBC: 2.77 MIL/uL — ABNORMAL LOW (ref 3.87–5.11)
RDW: 18.3 % — AB (ref 11.5–15.5)
WBC: 7.5 10*3/uL (ref 4.0–10.5)

## 2014-05-05 LAB — RENAL FUNCTION PANEL
Albumin: 2.4 g/dL — ABNORMAL LOW (ref 3.5–5.2)
Anion gap: 10 (ref 5–15)
BUN: 19 mg/dL (ref 6–23)
CALCIUM: 9 mg/dL (ref 8.4–10.5)
CO2: 27 mmol/L (ref 19–32)
Chloride: 98 mmol/L (ref 96–112)
Creatinine, Ser: 2.9 mg/dL — ABNORMAL HIGH (ref 0.50–1.10)
GFR calc Af Amer: 18 mL/min — ABNORMAL LOW (ref >90)
GFR, EST NON AFRICAN AMERICAN: 15 mL/min — AB (ref >90)
Glucose, Bld: 99 mg/dL (ref 70–99)
POTASSIUM: 4.2 mmol/L (ref 3.5–5.1)
Phosphorus: 4 mg/dL (ref 2.3–4.6)
Sodium: 135 mmol/L (ref 135–145)

## 2014-05-05 LAB — CBC
HCT: 26.2 % — ABNORMAL LOW (ref 36.0–46.0)
Hemoglobin: 8.5 g/dL — ABNORMAL LOW (ref 12.0–15.0)
MCH: 30.2 pg (ref 26.0–34.0)
MCHC: 32.4 g/dL (ref 30.0–36.0)
MCV: 93.2 fL (ref 78.0–100.0)
Platelets: 149 10*3/uL — ABNORMAL LOW (ref 150–400)
RBC: 2.81 MIL/uL — AB (ref 3.87–5.11)
RDW: 19.7 % — ABNORMAL HIGH (ref 11.5–15.5)
WBC: 7.8 10*3/uL (ref 4.0–10.5)

## 2014-05-06 LAB — HEPATIC FUNCTION PANEL
ALT: 22 U/L (ref 0–35)
AST: 25 U/L (ref 0–37)
Albumin: 2.7 g/dL — ABNORMAL LOW (ref 3.5–5.2)
Alkaline Phosphatase: 234 U/L — ABNORMAL HIGH (ref 39–117)
Bilirubin, Direct: 0.2 mg/dL (ref 0.0–0.5)
Indirect Bilirubin: 0.7 mg/dL (ref 0.3–0.9)
Total Bilirubin: 0.9 mg/dL (ref 0.3–1.2)
Total Protein: 5.5 g/dL — ABNORMAL LOW (ref 6.0–8.3)

## 2014-05-07 LAB — HEPATITIS B SURFACE ANTIGEN: Hepatitis B Surface Ag: NEGATIVE

## 2014-05-07 LAB — CBC
HEMATOCRIT: 27.8 % — AB (ref 36.0–46.0)
Hemoglobin: 9.1 g/dL — ABNORMAL LOW (ref 12.0–15.0)
MCH: 30.1 pg (ref 26.0–34.0)
MCHC: 32.7 g/dL (ref 30.0–36.0)
MCV: 92.1 fL (ref 78.0–100.0)
Platelets: 182 10*3/uL (ref 150–400)
RBC: 3.02 MIL/uL — AB (ref 3.87–5.11)
RDW: 19.4 % — ABNORMAL HIGH (ref 11.5–15.5)
WBC: 10.4 10*3/uL (ref 4.0–10.5)

## 2014-05-07 LAB — RENAL FUNCTION PANEL
ANION GAP: 13 (ref 5–15)
Albumin: 2.6 g/dL — ABNORMAL LOW (ref 3.5–5.2)
BUN: 16 mg/dL (ref 6–23)
CO2: 24 mmol/L (ref 19–32)
Calcium: 9.4 mg/dL (ref 8.4–10.5)
Chloride: 99 mmol/L (ref 96–112)
Creatinine, Ser: 2.71 mg/dL — ABNORMAL HIGH (ref 0.50–1.10)
GFR, EST AFRICAN AMERICAN: 19 mL/min — AB (ref >90)
GFR, EST NON AFRICAN AMERICAN: 16 mL/min — AB (ref >90)
Glucose, Bld: 140 mg/dL — ABNORMAL HIGH (ref 70–99)
Phosphorus: 3.4 mg/dL (ref 2.3–4.6)
Potassium: 4.6 mmol/L (ref 3.5–5.1)
SODIUM: 136 mmol/L (ref 135–145)

## 2014-05-10 LAB — RENAL FUNCTION PANEL
Albumin: 2.5 g/dL — ABNORMAL LOW (ref 3.5–5.2)
Anion gap: 6 (ref 5–15)
BUN: 39 mg/dL — AB (ref 6–23)
CO2: 26 mmol/L (ref 19–32)
Calcium: 8.8 mg/dL (ref 8.4–10.5)
Chloride: 100 mmol/L (ref 96–112)
Creatinine, Ser: 3.15 mg/dL — ABNORMAL HIGH (ref 0.50–1.10)
GFR calc non Af Amer: 14 mL/min — ABNORMAL LOW (ref >90)
GFR, EST AFRICAN AMERICAN: 16 mL/min — AB (ref >90)
Glucose, Bld: 92 mg/dL (ref 70–99)
Phosphorus: 4.1 mg/dL (ref 2.3–4.6)
Potassium: 5.6 mmol/L — ABNORMAL HIGH (ref 3.5–5.1)
Sodium: 132 mmol/L — ABNORMAL LOW (ref 135–145)

## 2014-05-10 LAB — CBC
HCT: 24.5 % — ABNORMAL LOW (ref 36.0–46.0)
HEMOGLOBIN: 8.1 g/dL — AB (ref 12.0–15.0)
MCH: 30.3 pg (ref 26.0–34.0)
MCHC: 33.1 g/dL (ref 30.0–36.0)
MCV: 91.8 fL (ref 78.0–100.0)
Platelets: 167 10*3/uL (ref 150–400)
RBC: 2.67 MIL/uL — ABNORMAL LOW (ref 3.87–5.11)
RDW: 18.9 % — AB (ref 11.5–15.5)
WBC: 9.6 10*3/uL (ref 4.0–10.5)

## 2014-05-11 LAB — CLOSTRIDIUM DIFFICILE BY PCR: CDIFFPCR: NEGATIVE

## 2014-05-12 LAB — RENAL FUNCTION PANEL
ALBUMIN: 2.6 g/dL — AB (ref 3.5–5.2)
ANION GAP: 7 (ref 5–15)
BUN: 35 mg/dL — ABNORMAL HIGH (ref 6–23)
CALCIUM: 9.5 mg/dL (ref 8.4–10.5)
CO2: 29 mmol/L (ref 19–32)
Chloride: 100 mmol/L (ref 96–112)
Creatinine, Ser: 2.82 mg/dL — ABNORMAL HIGH (ref 0.50–1.10)
GFR calc non Af Amer: 16 mL/min — ABNORMAL LOW (ref >90)
GFR, EST AFRICAN AMERICAN: 18 mL/min — AB (ref >90)
Glucose, Bld: 84 mg/dL (ref 70–99)
Phosphorus: 4 mg/dL (ref 2.3–4.6)
Potassium: 4.3 mmol/L (ref 3.5–5.1)
Sodium: 136 mmol/L (ref 135–145)

## 2014-05-12 LAB — CBC
HEMATOCRIT: 26.7 % — AB (ref 36.0–46.0)
HEMOGLOBIN: 8.7 g/dL — AB (ref 12.0–15.0)
MCH: 30.6 pg (ref 26.0–34.0)
MCHC: 32.6 g/dL (ref 30.0–36.0)
MCV: 94 fL (ref 78.0–100.0)
Platelets: 150 10*3/uL (ref 150–400)
RBC: 2.84 MIL/uL — ABNORMAL LOW (ref 3.87–5.11)
RDW: 19.1 % — AB (ref 11.5–15.5)
WBC: 8 10*3/uL (ref 4.0–10.5)

## 2014-05-14 LAB — CBC
HCT: 27.7 % — ABNORMAL LOW (ref 36.0–46.0)
HEMOGLOBIN: 9 g/dL — AB (ref 12.0–15.0)
MCH: 30.1 pg (ref 26.0–34.0)
MCHC: 32.5 g/dL (ref 30.0–36.0)
MCV: 92.6 fL (ref 78.0–100.0)
PLATELETS: 165 10*3/uL (ref 150–400)
RBC: 2.99 MIL/uL — ABNORMAL LOW (ref 3.87–5.11)
RDW: 19 % — ABNORMAL HIGH (ref 11.5–15.5)
WBC: 9 10*3/uL (ref 4.0–10.5)

## 2014-05-14 LAB — RENAL FUNCTION PANEL
ANION GAP: 11 (ref 5–15)
Albumin: 2.6 g/dL — ABNORMAL LOW (ref 3.5–5.2)
BUN: 25 mg/dL — AB (ref 6–23)
CALCIUM: 9.1 mg/dL (ref 8.4–10.5)
CO2: 25 mmol/L (ref 19–32)
Chloride: 100 mmol/L (ref 96–112)
Creatinine, Ser: 2.72 mg/dL — ABNORMAL HIGH (ref 0.50–1.10)
GFR calc non Af Amer: 16 mL/min — ABNORMAL LOW (ref >90)
GFR, EST AFRICAN AMERICAN: 19 mL/min — AB (ref >90)
GLUCOSE: 136 mg/dL — AB (ref 70–99)
PHOSPHORUS: 4.3 mg/dL (ref 2.3–4.6)
POTASSIUM: 4.8 mmol/L (ref 3.5–5.1)
SODIUM: 136 mmol/L (ref 135–145)

## 2014-05-16 LAB — PROTIME-INR
INR: 1.09 (ref 0.00–1.49)
Prothrombin Time: 14.2 seconds (ref 11.6–15.2)

## 2014-05-17 LAB — CBC
HCT: 26.6 % — ABNORMAL LOW (ref 36.0–46.0)
Hemoglobin: 8.8 g/dL — ABNORMAL LOW (ref 12.0–15.0)
MCH: 30.4 pg (ref 26.0–34.0)
MCHC: 33.1 g/dL (ref 30.0–36.0)
MCV: 92 fL (ref 78.0–100.0)
Platelets: 145 10*3/uL — ABNORMAL LOW (ref 150–400)
RBC: 2.89 MIL/uL — AB (ref 3.87–5.11)
RDW: 18.4 % — AB (ref 11.5–15.5)
WBC: 7.9 10*3/uL (ref 4.0–10.5)

## 2014-05-17 LAB — RENAL FUNCTION PANEL
ALBUMIN: 2.4 g/dL — AB (ref 3.5–5.2)
ANION GAP: 12 (ref 5–15)
BUN: 42 mg/dL — AB (ref 6–23)
CO2: 24 mmol/L (ref 19–32)
CREATININE: 3.76 mg/dL — AB (ref 0.50–1.10)
Calcium: 9.1 mg/dL (ref 8.4–10.5)
Chloride: 98 mmol/L (ref 96–112)
GFR calc Af Amer: 13 mL/min — ABNORMAL LOW (ref >90)
GFR calc non Af Amer: 11 mL/min — ABNORMAL LOW (ref >90)
GLUCOSE: 83 mg/dL (ref 70–99)
PHOSPHORUS: 3.9 mg/dL (ref 2.3–4.6)
Potassium: 5 mmol/L (ref 3.5–5.1)
Sodium: 134 mmol/L — ABNORMAL LOW (ref 135–145)

## 2014-05-17 LAB — PROTIME-INR
INR: 1.09 (ref 0.00–1.49)
Prothrombin Time: 14.2 seconds (ref 11.6–15.2)

## 2014-05-18 LAB — PROTIME-INR
INR: 1.07 (ref 0.00–1.49)
PROTHROMBIN TIME: 14.1 s (ref 11.6–15.2)

## 2014-05-19 LAB — RENAL FUNCTION PANEL
Albumin: 2.6 g/dL — ABNORMAL LOW (ref 3.5–5.2)
Anion gap: 8 (ref 5–15)
BUN: 26 mg/dL — ABNORMAL HIGH (ref 6–23)
CO2: 30 mmol/L (ref 19–32)
Calcium: 9.5 mg/dL (ref 8.4–10.5)
Chloride: 99 mmol/L (ref 96–112)
Creatinine, Ser: 3.05 mg/dL — ABNORMAL HIGH (ref 0.50–1.10)
GFR calc Af Amer: 17 mL/min — ABNORMAL LOW (ref >90)
GFR calc non Af Amer: 14 mL/min — ABNORMAL LOW (ref >90)
GLUCOSE: 80 mg/dL (ref 70–99)
PHOSPHORUS: 4.9 mg/dL — AB (ref 2.3–4.6)
Potassium: 5.2 mmol/L — ABNORMAL HIGH (ref 3.5–5.1)
Sodium: 137 mmol/L (ref 135–145)

## 2014-05-19 LAB — CBC
HCT: 29.4 % — ABNORMAL LOW (ref 36.0–46.0)
HEMOGLOBIN: 9.4 g/dL — AB (ref 12.0–15.0)
MCH: 30.2 pg (ref 26.0–34.0)
MCHC: 32 g/dL (ref 30.0–36.0)
MCV: 94.5 fL (ref 78.0–100.0)
PLATELETS: 146 10*3/uL — AB (ref 150–400)
RBC: 3.11 MIL/uL — ABNORMAL LOW (ref 3.87–5.11)
RDW: 18.3 % — ABNORMAL HIGH (ref 11.5–15.5)
WBC: 9.4 10*3/uL (ref 4.0–10.5)

## 2014-05-19 LAB — PROTIME-INR
INR: 1.05 (ref 0.00–1.49)
PROTHROMBIN TIME: 13.8 s (ref 11.6–15.2)

## 2014-05-20 LAB — PROTIME-INR
INR: 1.06 (ref 0.00–1.49)
Prothrombin Time: 13.9 seconds (ref 11.6–15.2)

## 2014-05-21 LAB — CBC
HCT: 27.6 % — ABNORMAL LOW (ref 36.0–46.0)
HEMOGLOBIN: 8.9 g/dL — AB (ref 12.0–15.0)
MCH: 30.9 pg (ref 26.0–34.0)
MCHC: 32.2 g/dL (ref 30.0–36.0)
MCV: 95.8 fL (ref 78.0–100.0)
Platelets: 125 10*3/uL — ABNORMAL LOW (ref 150–400)
RBC: 2.88 MIL/uL — AB (ref 3.87–5.11)
RDW: 17.9 % — ABNORMAL HIGH (ref 11.5–15.5)
WBC: 8.5 10*3/uL (ref 4.0–10.5)

## 2014-05-21 LAB — RENAL FUNCTION PANEL
ANION GAP: 8 (ref 5–15)
Albumin: 2.5 g/dL — ABNORMAL LOW (ref 3.5–5.2)
BUN: 26 mg/dL — AB (ref 6–23)
CO2: 30 mmol/L (ref 19–32)
CREATININE: 3.06 mg/dL — AB (ref 0.50–1.10)
Calcium: 9.2 mg/dL (ref 8.4–10.5)
Chloride: 99 mmol/L (ref 96–112)
GFR calc Af Amer: 16 mL/min — ABNORMAL LOW (ref >90)
GFR calc non Af Amer: 14 mL/min — ABNORMAL LOW (ref >90)
Glucose, Bld: 73 mg/dL (ref 70–99)
PHOSPHORUS: 4 mg/dL (ref 2.3–4.6)
Potassium: 4.3 mmol/L (ref 3.5–5.1)
Sodium: 137 mmol/L (ref 135–145)

## 2014-05-21 LAB — PROTIME-INR
INR: 1.08 (ref 0.00–1.49)
Prothrombin Time: 14.1 seconds (ref 11.6–15.2)

## 2014-05-21 LAB — HEPATITIS B SURFACE ANTIGEN: Hepatitis B Surface Ag: NEGATIVE

## 2014-05-22 LAB — CBC
HCT: 29.1 % — ABNORMAL LOW (ref 36.0–46.0)
HEMOGLOBIN: 9.1 g/dL — AB (ref 12.0–15.0)
MCH: 30 pg (ref 26.0–34.0)
MCHC: 31.3 g/dL (ref 30.0–36.0)
MCV: 96 fL (ref 78.0–100.0)
PLATELETS: 141 10*3/uL — AB (ref 150–400)
RBC: 3.03 MIL/uL — ABNORMAL LOW (ref 3.87–5.11)
RDW: 17.8 % — ABNORMAL HIGH (ref 11.5–15.5)
WBC: 8 10*3/uL (ref 4.0–10.5)

## 2014-05-22 LAB — PROTIME-INR
INR: 1.13 (ref 0.00–1.49)
PROTHROMBIN TIME: 14.6 s (ref 11.6–15.2)

## 2014-05-23 LAB — PROTIME-INR
INR: 1.1 (ref 0.00–1.49)
Prothrombin Time: 14.4 seconds (ref 11.6–15.2)

## 2014-05-24 LAB — CBC
HCT: 27.2 % — ABNORMAL LOW (ref 36.0–46.0)
Hemoglobin: 8.9 g/dL — ABNORMAL LOW (ref 12.0–15.0)
MCH: 30.8 pg (ref 26.0–34.0)
MCHC: 32.7 g/dL (ref 30.0–36.0)
MCV: 94.1 fL (ref 78.0–100.0)
Platelets: 131 10*3/uL — ABNORMAL LOW (ref 150–400)
RBC: 2.89 MIL/uL — AB (ref 3.87–5.11)
RDW: 17 % — ABNORMAL HIGH (ref 11.5–15.5)
WBC: 8.5 10*3/uL (ref 4.0–10.5)

## 2014-05-24 LAB — RENAL FUNCTION PANEL
ANION GAP: 10 (ref 5–15)
Albumin: 2.6 g/dL — ABNORMAL LOW (ref 3.5–5.2)
BUN: 34 mg/dL — AB (ref 6–23)
CHLORIDE: 98 mmol/L (ref 96–112)
CO2: 26 mmol/L (ref 19–32)
Calcium: 9.1 mg/dL (ref 8.4–10.5)
Creatinine, Ser: 3.67 mg/dL — ABNORMAL HIGH (ref 0.50–1.10)
GFR calc Af Amer: 13 mL/min — ABNORMAL LOW (ref >90)
GFR calc non Af Amer: 11 mL/min — ABNORMAL LOW (ref >90)
Glucose, Bld: 78 mg/dL (ref 70–99)
Phosphorus: 4.3 mg/dL (ref 2.3–4.6)
Potassium: 4.8 mmol/L (ref 3.5–5.1)
SODIUM: 134 mmol/L — AB (ref 135–145)

## 2014-05-24 LAB — PROTIME-INR
INR: 1.03 (ref 0.00–1.49)
PROTHROMBIN TIME: 13.6 s (ref 11.6–15.2)

## 2014-05-25 LAB — PROTIME-INR
INR: 1.05 (ref 0.00–1.49)
Prothrombin Time: 13.8 seconds (ref 11.6–15.2)

## 2014-05-26 LAB — CBC
HCT: 29.2 % — ABNORMAL LOW (ref 36.0–46.0)
Hemoglobin: 9.5 g/dL — ABNORMAL LOW (ref 12.0–15.0)
MCH: 30.3 pg (ref 26.0–34.0)
MCHC: 32.5 g/dL (ref 30.0–36.0)
MCV: 93 fL (ref 78.0–100.0)
Platelets: UNDETERMINED 10*3/uL (ref 150–400)
RBC: 3.14 MIL/uL — AB (ref 3.87–5.11)
RDW: 16.8 % — AB (ref 11.5–15.5)
WBC: 7.6 10*3/uL (ref 4.0–10.5)

## 2014-05-26 LAB — RENAL FUNCTION PANEL
Albumin: 2.6 g/dL — ABNORMAL LOW (ref 3.5–5.2)
Anion gap: 8 (ref 5–15)
BUN: 25 mg/dL — ABNORMAL HIGH (ref 6–23)
CALCIUM: 9.5 mg/dL (ref 8.4–10.5)
CO2: 29 mmol/L (ref 19–32)
CREATININE: 3.11 mg/dL — AB (ref 0.50–1.10)
Chloride: 95 mmol/L — ABNORMAL LOW (ref 96–112)
GFR calc Af Amer: 16 mL/min — ABNORMAL LOW (ref >90)
GFR, EST NON AFRICAN AMERICAN: 14 mL/min — AB (ref >90)
Glucose, Bld: 86 mg/dL (ref 70–99)
POTASSIUM: 5 mmol/L (ref 3.5–5.1)
Phosphorus: 4.2 mg/dL (ref 2.3–4.6)
SODIUM: 132 mmol/L — AB (ref 135–145)

## 2014-05-26 LAB — PROTIME-INR
INR: 0.95 (ref 0.00–1.49)
Prothrombin Time: 12.8 seconds (ref 11.6–15.2)

## 2014-05-27 LAB — PROTIME-INR
INR: 1.06 (ref 0.00–1.49)
Prothrombin Time: 13.9 seconds (ref 11.6–15.2)

## 2014-05-28 LAB — CBC
HCT: 30.6 % — ABNORMAL LOW (ref 36.0–46.0)
Hemoglobin: 9.7 g/dL — ABNORMAL LOW (ref 12.0–15.0)
MCH: 29.7 pg (ref 26.0–34.0)
MCHC: 31.7 g/dL (ref 30.0–36.0)
MCV: 93.6 fL (ref 78.0–100.0)
Platelets: 188 10*3/uL (ref 150–400)
RBC: 3.27 MIL/uL — ABNORMAL LOW (ref 3.87–5.11)
RDW: 16.7 % — AB (ref 11.5–15.5)
WBC: 7.7 10*3/uL (ref 4.0–10.5)

## 2014-05-28 LAB — RENAL FUNCTION PANEL
ANION GAP: 9 (ref 5–15)
Albumin: 2.7 g/dL — ABNORMAL LOW (ref 3.5–5.2)
BUN: 20 mg/dL (ref 6–23)
CO2: 28 mmol/L (ref 19–32)
Calcium: 10.3 mg/dL (ref 8.4–10.5)
Chloride: 97 mmol/L (ref 96–112)
Creatinine, Ser: 3.07 mg/dL — ABNORMAL HIGH (ref 0.50–1.10)
GFR calc Af Amer: 16 mL/min — ABNORMAL LOW (ref >90)
GFR calc non Af Amer: 14 mL/min — ABNORMAL LOW (ref >90)
Glucose, Bld: 98 mg/dL (ref 70–99)
Phosphorus: 3.8 mg/dL (ref 2.3–4.6)
Potassium: 4.4 mmol/L (ref 3.5–5.1)
SODIUM: 134 mmol/L — AB (ref 135–145)

## 2014-05-28 LAB — HEPATITIS C ANTIBODY: HCV AB: NEGATIVE

## 2014-05-28 LAB — PROTIME-INR
INR: 1.11 (ref 0.00–1.49)
Prothrombin Time: 14.4 seconds (ref 11.6–15.2)

## 2014-05-29 LAB — PROTIME-INR
INR: 1.27 (ref 0.00–1.49)
Prothrombin Time: 16 seconds — ABNORMAL HIGH (ref 11.6–15.2)

## 2014-05-29 LAB — HIV ANTIBODY (ROUTINE TESTING W REFLEX): HIV SCREEN 4TH GENERATION: NONREACTIVE

## 2014-05-30 LAB — PROTIME-INR
INR: 1.05 (ref 0.00–1.49)
Prothrombin Time: 13.8 seconds (ref 11.6–15.2)

## 2014-05-31 LAB — CBC
HCT: 28.5 % — ABNORMAL LOW (ref 36.0–46.0)
HEMOGLOBIN: 9.1 g/dL — AB (ref 12.0–15.0)
MCH: 29.7 pg (ref 26.0–34.0)
MCHC: 31.9 g/dL (ref 30.0–36.0)
MCV: 93.1 fL (ref 78.0–100.0)
Platelets: 178 10*3/uL (ref 150–400)
RBC: 3.06 MIL/uL — ABNORMAL LOW (ref 3.87–5.11)
RDW: 16.4 % — ABNORMAL HIGH (ref 11.5–15.5)
WBC: 7.4 10*3/uL (ref 4.0–10.5)

## 2014-05-31 LAB — RENAL FUNCTION PANEL
Albumin: 2.5 g/dL — ABNORMAL LOW (ref 3.5–5.2)
Anion gap: 12 (ref 5–15)
BUN: 24 mg/dL — ABNORMAL HIGH (ref 6–23)
CALCIUM: 9.9 mg/dL (ref 8.4–10.5)
CO2: 26 mmol/L (ref 19–32)
Chloride: 95 mmol/L — ABNORMAL LOW (ref 96–112)
Creatinine, Ser: 3.83 mg/dL — ABNORMAL HIGH (ref 0.50–1.10)
GFR calc Af Amer: 13 mL/min — ABNORMAL LOW (ref >90)
GFR, EST NON AFRICAN AMERICAN: 11 mL/min — AB (ref >90)
Glucose, Bld: 86 mg/dL (ref 70–99)
Phosphorus: 3.2 mg/dL (ref 2.3–4.6)
Potassium: 4.2 mmol/L (ref 3.5–5.1)
SODIUM: 133 mmol/L — AB (ref 135–145)

## 2014-05-31 LAB — PROTIME-INR
INR: 1.06 (ref 0.00–1.49)
PROTHROMBIN TIME: 13.9 s (ref 11.6–15.2)

## 2014-05-31 NOTE — Progress Notes (Signed)
Patient ID: Mallory Pitts, female   DOB: Oct 26, 1940, 74 y.o.   MRN: [id number]   [facility] requesting "OK" to not replace G tube for this pt.  Pt has xray confirmation of G tube in place per 03/25/14 [facility] Placed at outside facility and admitted to [facility] with G tube intact  Pt now eating and does not require G tube She inadvertently pulled out existing G tube 3/27 pm--- RN placed foley into access.  Dr Sharyon MedicusHijazi ordered replacement only because he thought G tube had to be in place 2 months prior to removal.  IR recommends removal only after 5-6 weeks of placement;  But This pt does have evidence of existing G tube even 03/25/14  Discussed with RN Shammie We feel it is fine to not replace G tube and remove foley if pt no longer requires G tube access.

## 2014-06-01 LAB — PROTIME-INR
INR: 1.13 (ref 0.00–1.49)
Prothrombin Time: 14.6 seconds (ref 11.6–15.2)

## 2014-06-02 LAB — CBC
HEMATOCRIT: 27.8 % — AB (ref 36.0–46.0)
Hemoglobin: 9 g/dL — ABNORMAL LOW (ref 12.0–15.0)
MCH: 30.1 pg (ref 26.0–34.0)
MCHC: 32.4 g/dL (ref 30.0–36.0)
MCV: 93 fL (ref 78.0–100.0)
PLATELETS: 199 10*3/uL (ref 150–400)
RBC: 2.99 MIL/uL — ABNORMAL LOW (ref 3.87–5.11)
RDW: 16.4 % — AB (ref 11.5–15.5)
WBC: 10.5 10*3/uL (ref 4.0–10.5)

## 2014-06-02 LAB — RENAL FUNCTION PANEL
Albumin: 2.3 g/dL — ABNORMAL LOW (ref 3.5–5.2)
Anion gap: 10 (ref 5–15)
BUN: 16 mg/dL (ref 6–23)
CALCIUM: 9.9 mg/dL (ref 8.4–10.5)
CO2: 30 mmol/L (ref 19–32)
Chloride: 96 mmol/L (ref 96–112)
Creatinine, Ser: 3.06 mg/dL — ABNORMAL HIGH (ref 0.50–1.10)
GFR, EST AFRICAN AMERICAN: 16 mL/min — AB (ref >90)
GFR, EST NON AFRICAN AMERICAN: 14 mL/min — AB (ref >90)
GLUCOSE: 89 mg/dL (ref 70–99)
PHOSPHORUS: 3.2 mg/dL (ref 2.3–4.6)
Potassium: 3.9 mmol/L (ref 3.5–5.1)
SODIUM: 136 mmol/L (ref 135–145)

## 2014-06-02 LAB — PROTIME-INR
INR: 1.15 (ref 0.00–1.49)
Prothrombin Time: 14.9 seconds (ref 11.6–15.2)

## 2014-11-04 DEATH — deceased

## 2016-05-19 IMAGING — CT CT ABD-PELV W/ CM
2 of 5 series · 16 of 46 positions shown, 18 images · IV contrast (omnipaque)
Comparison: None.

CLINICAL DATA: Rectal perforation 02/24/2014. Intra-abdominal
abscess.

EXAM:
CT ABDOMEN AND PELVIS WITH CONTRAST
TECHNIQUE: Multidetector CT imaging of the abdomen and pelvis was performed
using the standard protocol following bolus administration of
intravenous contrast.
CONTRAST:  100mL OMNIPAQUE IOHEXOL 300 MG/ML  SOLN

[Series 3: abd/ pelvis 5.0 i30f 1 · axial · 0.98mm/px · z∈[+1125,+1580]mm · 13 of 103 slices shown, 15 images]
[im 6/103  soft-tissue]
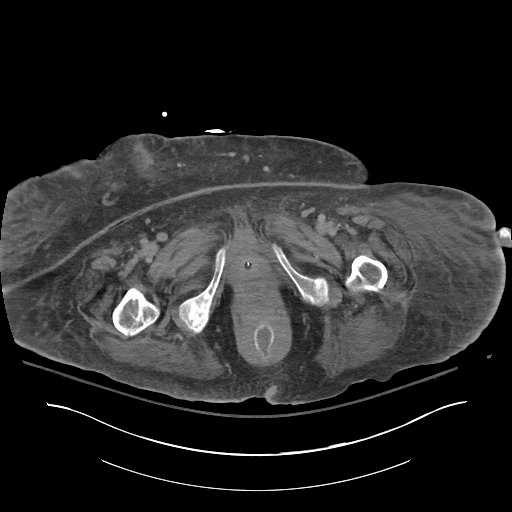
[im 6/103  bone]
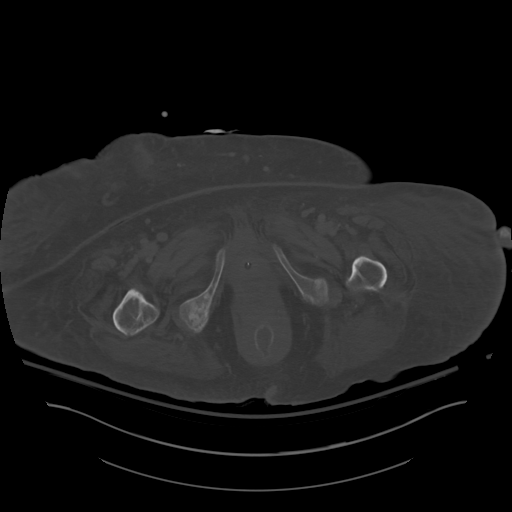
[im 17/103  soft-tissue]
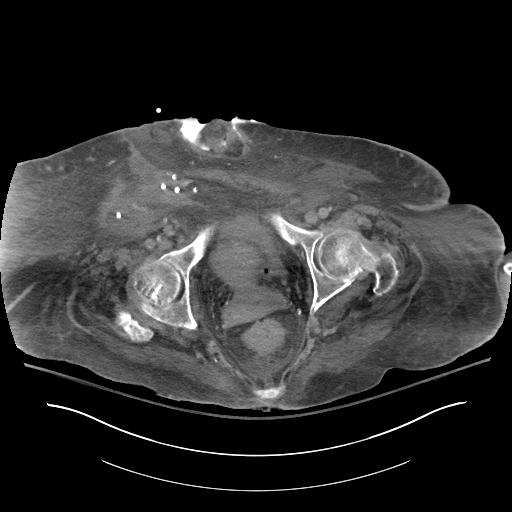
[im 22/103  soft-tissue]
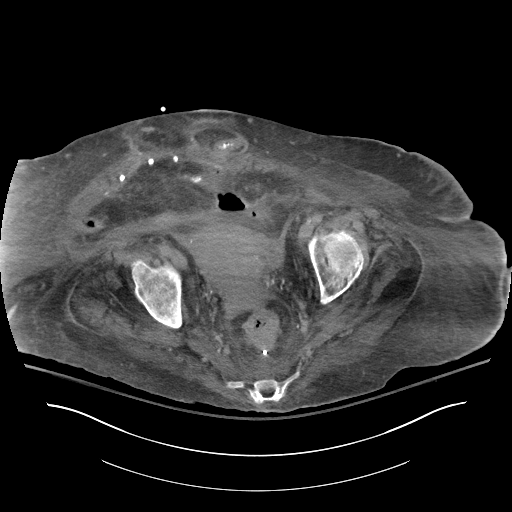
[im 27/103  soft-tissue]
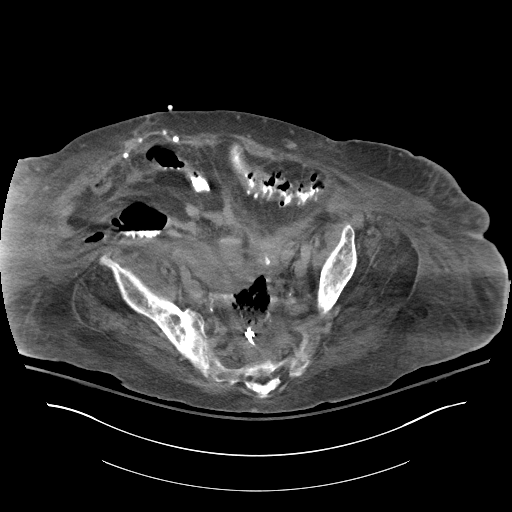
[im 38/103  soft-tissue]
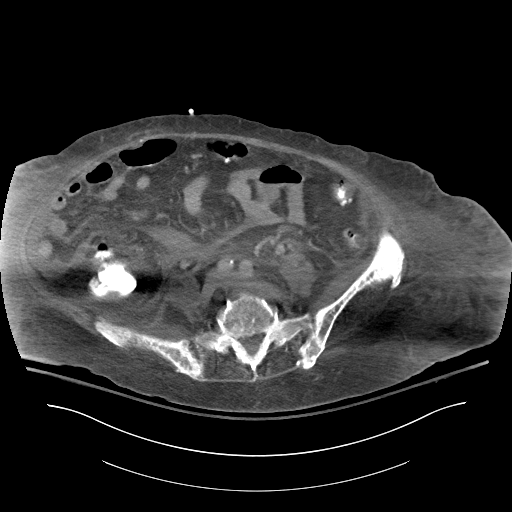
[im 43/103  soft-tissue]
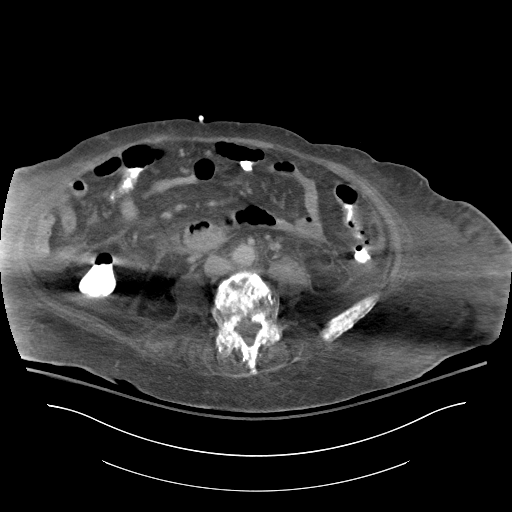
[im 54/103  soft-tissue]
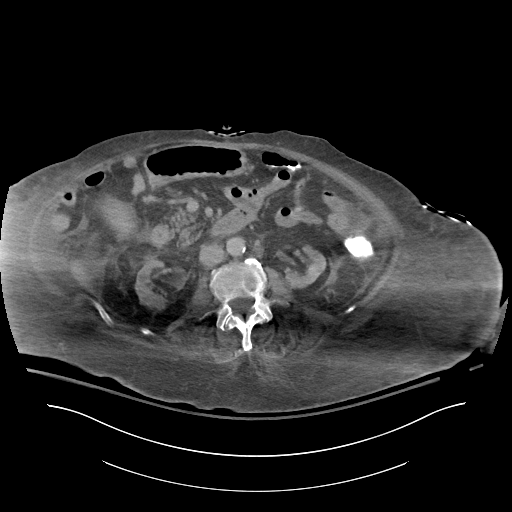
[im 60/103  soft-tissue]
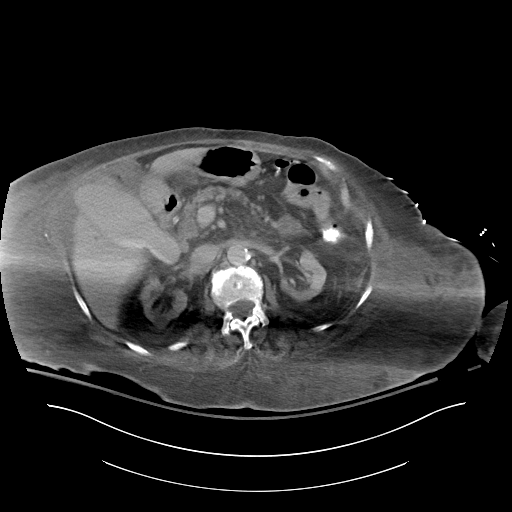
[im 65/103  soft-tissue]
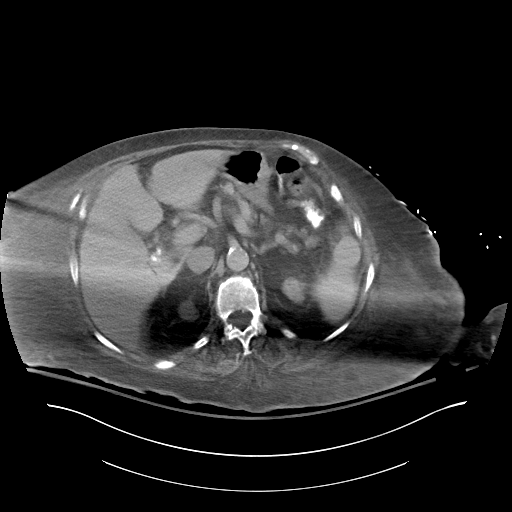
[im 65/103  bone]
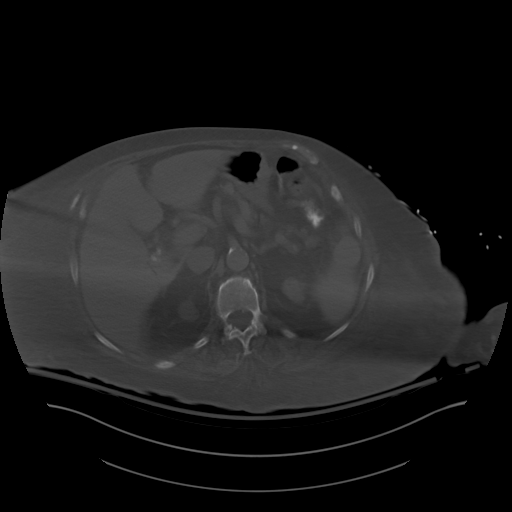
[im 76/103  soft-tissue]
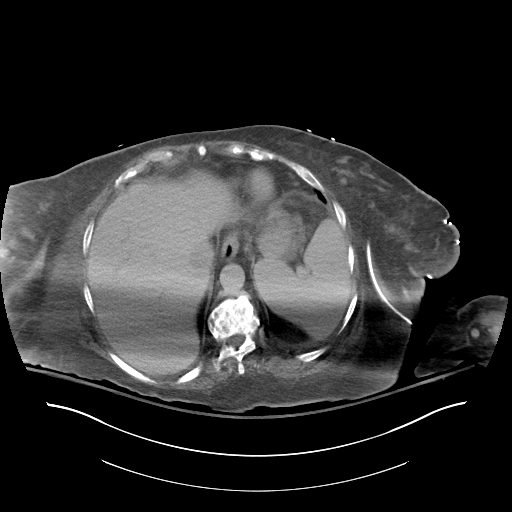
[im 81/103  soft-tissue]
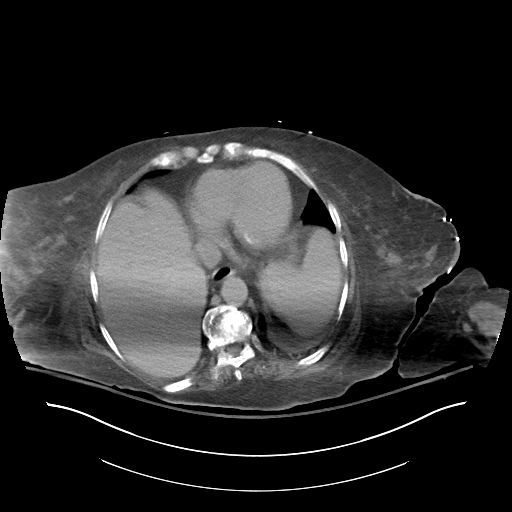
[im 86/103  soft-tissue]
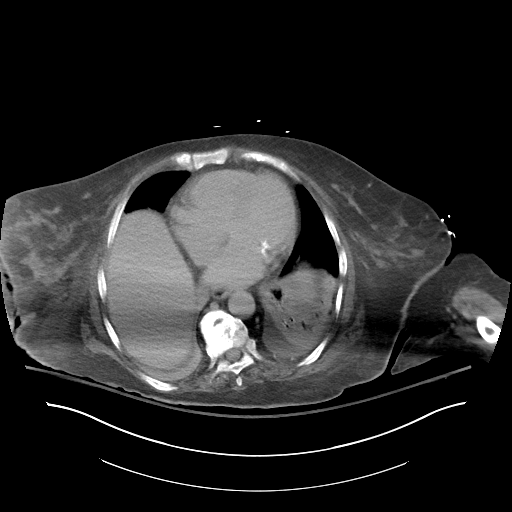
[im 97/103  soft-tissue]
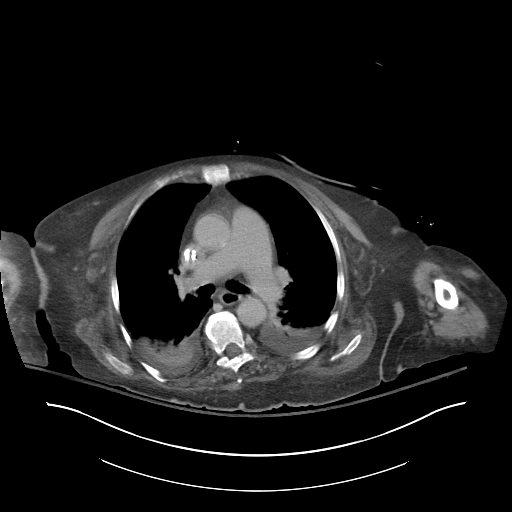

[Series 5: coronals · coronal · 0.97mm/px · 3 of 137 slices shown]
[im 46/137  soft-tissue]
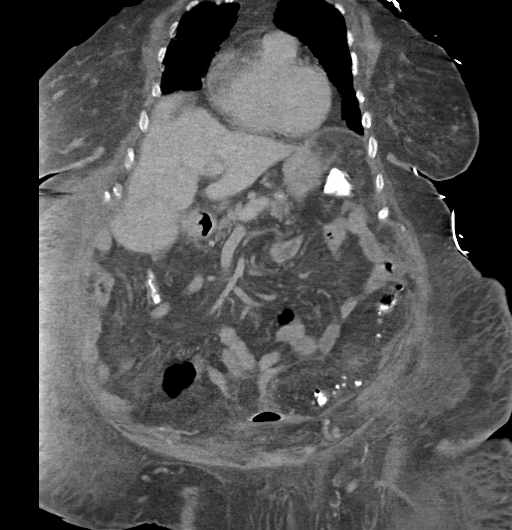
[im 61/137  soft-tissue]
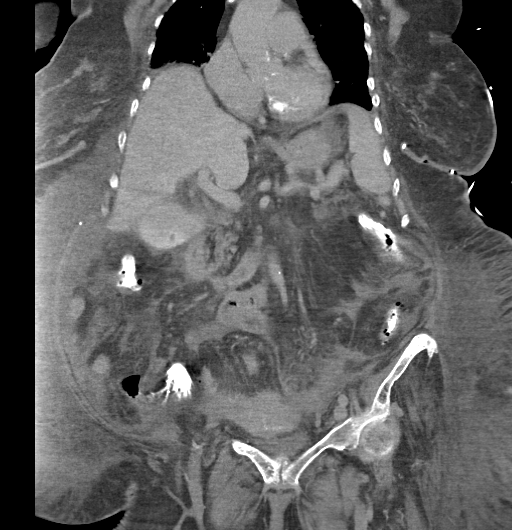
[im 76/137  soft-tissue]
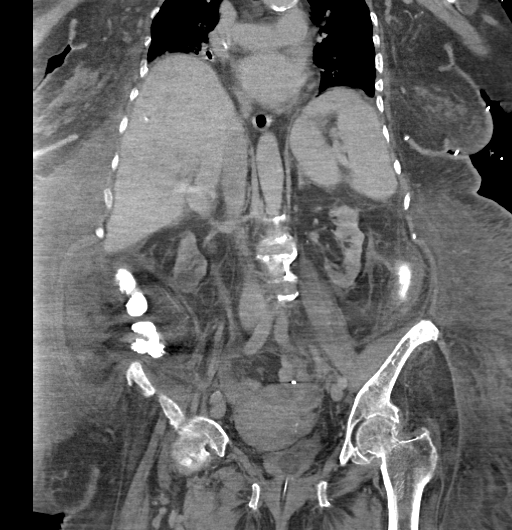

[16 of 46 positions shown; findings below may reference images not displayed]

FINDINGS: Lower chest: Small bilateral effusions. There is lower lobe
consolidation with air bronchograms, right greater than left.

Hepatobiliary: There is a 17 mm left hepatic lobe cyst. No other
focal liver lesions. Gallbladder contains vicarious contrast and
probable calculus. No bile duct dilatation.

Pancreas: There is a 2.5 cm indeterminate pancreatic tail cyst.
Question an additional 1.7 cm pancreatic head cyst. Neither of these
is characterized.

Spleen: Unremarkable

Adrenals/Urinary Tract: The adrenals appear normal. There are cysts
about both kidneys, as well as renal atrophy. The largest cyst
measures 2.9 cm in the right lower pole and appears somewhat
complex, not characterized.

Stomach/Bowel: Stomach and small bowel appear unremarkable. There is
a distal descending colostomy in the low midline. There is a rectal
stump.

Vascular/Lymphatic: Mildly prominent retroperitoneal and mesenteric
nodes are present. The largest retroperitoneal node is in the left
para-aortic region below the left renal vein, measuring 1.7 cm.
These nodes may be reactive.

Reproductive: Unremarkable

Other: There is a 2.6 by 6.6 cm subcutaneous collection in the low
anterior abdomen just to the right of midline, slightly below the
ostomy. At the level of the ostomy there is a 2.1 x 3.1 cm
collection of extraluminal air in the peritoneum, visible on image
82 series 3. This presumably represents residual abscess cavity.
Directly anterior to the rectal stump there is a 3.7 x 4.5 cm
collection which is indeterminate. This could represent another
abscess. It may represent stool within the rectal stump as the
boundaries of the rectal stump are poorly defined.

There is extensive subcutaneous edema consistent with anasarca.
There also is edema throughout the mesentery and small volume
ascites in the pericolic gutters.

*
Musculoskeletal: Poor bone density. Marked sclerosis of many of the
bones indeterminate pancreatic cyst,, not characterized. Possibly
renal osteodystrophy.
IMPRESSION: *2.6 x 6.6 cm subcutaneous collection in the anterior abdomen just
below and to the right of the ostomy, likely abscess in this
clinical setting
*2.1 x 3.1 cm collection of extraluminal air and in the anterior
peritoneum at the level of the ostomy, likely residual abscess
cavity
*3.7 x 4.57 cm collection adjacent to the rectal stump,
indeterminate with regard to rectal stump contents versus adjacent
abscess.
*Extensive anasarca, mesenteric edema, small volume ascites
*Indeterminate pancreatic cysts measuring at 2.5 cm.
*Indeterminate renal cysts
*If outside CT scans can be made available, we will compare and
provide an addendum
